# Patient Record
Sex: Female | Born: 1980 | Race: White | Hispanic: No | Marital: Married | Smoking: Never smoker
Health system: Southern US, Community
[De-identification: ages and names within clinical notes are randomized; demographics above are authoritative.]

## PROBLEM LIST (undated history)

## (undated) DIAGNOSIS — G43909 Migraine, unspecified, not intractable, without status migrainosus: Secondary | ICD-10-CM

## (undated) DIAGNOSIS — M5430 Sciatica, unspecified side: Secondary | ICD-10-CM

## (undated) DIAGNOSIS — F419 Anxiety disorder, unspecified: Secondary | ICD-10-CM

## (undated) DIAGNOSIS — M25562 Pain in left knee: Secondary | ICD-10-CM

## (undated) DIAGNOSIS — F1190 Opioid use, unspecified, uncomplicated: Secondary | ICD-10-CM

## (undated) DIAGNOSIS — F4310 Post-traumatic stress disorder, unspecified: Secondary | ICD-10-CM

## (undated) DIAGNOSIS — S060XAA Concussion with loss of consciousness status unknown, initial encounter: Secondary | ICD-10-CM

## (undated) DIAGNOSIS — M545 Low back pain, unspecified: Secondary | ICD-10-CM

## (undated) DIAGNOSIS — E282 Polycystic ovarian syndrome: Secondary | ICD-10-CM

## (undated) DIAGNOSIS — F319 Bipolar disorder, unspecified: Secondary | ICD-10-CM

## (undated) HISTORY — DX: Opioid use, unspecified, uncomplicated: F11.90

## (undated) HISTORY — DX: Low back pain, unspecified: M54.50

## (undated) HISTORY — PX: FINGER AMPUTATION: SHX636

## (undated) HISTORY — DX: Sciatica, unspecified side: M54.30

## (undated) HISTORY — DX: Post-traumatic stress disorder, unspecified: F43.10

## (undated) HISTORY — DX: Bipolar disorder, unspecified: F31.9

## (undated) HISTORY — DX: Concussion with loss of consciousness status unknown, initial encounter: S06.0XAA

## (undated) HISTORY — PX: TONSILLECTOMY: SUR1361

## (undated) HISTORY — DX: Pain in left knee: M25.562

## (undated) HISTORY — DX: Anxiety disorder, unspecified: F41.9

---
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
## 2001-03-18 ENCOUNTER — Encounter (INDEPENDENT_AMBULATORY_CARE_PROVIDER_SITE_OTHER): Payer: Self-pay | Admitting: *Deleted

## 2001-03-18 ENCOUNTER — Ambulatory Visit (HOSPITAL_BASED_OUTPATIENT_CLINIC_OR_DEPARTMENT_OTHER): Admission: RE | Admit: 2001-03-18 | Discharge: 2001-03-18 | Payer: Self-pay | Admitting: Otolaryngology

## 2004-02-24 ENCOUNTER — Emergency Department (HOSPITAL_COMMUNITY): Admission: EM | Admit: 2004-02-24 | Discharge: 2004-02-24 | Payer: Self-pay | Admitting: Emergency Medicine

## 2004-04-26 ENCOUNTER — Emergency Department (HOSPITAL_COMMUNITY): Admission: EM | Admit: 2004-04-26 | Discharge: 2004-04-26 | Payer: Self-pay | Admitting: *Deleted

## 2004-06-07 ENCOUNTER — Ambulatory Visit (HOSPITAL_COMMUNITY): Admission: RE | Admit: 2004-06-07 | Discharge: 2004-06-07 | Payer: Self-pay | Admitting: *Deleted

## 2007-05-31 ENCOUNTER — Emergency Department (HOSPITAL_COMMUNITY): Admission: EM | Admit: 2007-05-31 | Discharge: 2007-05-31 | Payer: Self-pay | Admitting: Emergency Medicine

## 2008-12-20 ENCOUNTER — Emergency Department (HOSPITAL_COMMUNITY): Admission: EM | Admit: 2008-12-20 | Discharge: 2008-12-21 | Payer: Self-pay | Admitting: Emergency Medicine

## 2010-10-21 NOTE — Op Note (Signed)
[place]  Patient:    Leslie Larson, Leslie Larson Visit Number: [id number] MRN: [id number]          Service Type: DSU Location: [facility] Attending Physician:  Susy Frizzle Dictated by:   Jeannett Senior Pollyann Kennedy, M.D. Proc. Date: 03/18/01 Admit Date:  03/18/2001                             Operative Report  PREOPERATIVE DIAGNOSIS:  Chronic tonsillitis.  POSTOPERATIVE DIAGNOSIS:  Chronic tonsillitis.  OPERATION PERFORMED:  Tonsillectomy.  SURGEON:  Jefry H. Pollyann Kennedy, M.D.  ANESTHESIA:  General endotracheal.  COMPLICATIONS:  None.  ESTIMATED BLOOD LOSS:  None.  OPERATIVE FINDINGS:  Diffuse enlargement of the tonsils with deep cryptic spaces, no evidence of debris or infection present today.  The patient tolerated the procedure well, was awakened, extubated and transferred to the recovery room in stable condition.  INDICATIONS FOR PROCEDURE:  The patient is an 30 year old girl with a lifelong history of recurring streptococcal tonsillitis multiple times each year.  The risks, benefits, alternatives and complications of the procedure were explained to the patient and her parents who seemed to understand and agreed to surgery.  DESCRIPTION OF PROCEDURE:  The patient was taken to the operating room and placed on the operating table in supine position.  Following induction of general endotracheal anesthesia, the table was turned 90 degrees.  The patient was draped in a standard fashion.  A Crowe-Davis mouth gag was inserted into the oral cavity and used to retract the tongue and mandible and fastened to the Mayo stand.  A red rubber catheter was inserted into the right side of the nose and withdrawn through the mouth and used to retract the soft palate and uvula.  Indirect exam of the nasopharynx was performed.  The adenoid tissue was extremely small and without any signs of infection of inflammation. Tonsils were dissected cleanly from their fossa between the  capsule and the constrictor muscles using electrocautery dissection.  There were no vessels encountered during the dissection.  They were sent together for pathologic evaluation. There was no bleeding encountered.  The pharynx was suctioned of secretions and the orogastric tube was used to aspirate the contents of the stomach.  The mouth gag was released and the pharynx was reinspected.  Again there was no bleeding noted.  The patient was then awakened, extubated and transferred to recovery. Dictated by:   Jeannett Senior Pollyann Kennedy, M.D. Attending Physician:  Susy Frizzle DD:  03/18/01 TD:  03/18/01 Job: [id number]

## 2011-03-10 LAB — HCG, QUANTITATIVE, PREGNANCY: hCG, Beta Chain, Quant, S: <2

## 2012-04-18 ENCOUNTER — Emergency Department (HOSPITAL_COMMUNITY)
Admission: EM | Admit: 2012-04-18 | Discharge: 2012-04-18 | Disposition: A | Discharge status: Home / Self Care | Payer: Medicaid Other | Attending: Emergency Medicine | Admitting: Emergency Medicine

## 2012-04-18 ENCOUNTER — Encounter (HOSPITAL_COMMUNITY): Payer: Self-pay | Admitting: *Deleted

## 2012-04-18 ENCOUNTER — Emergency Department (HOSPITAL_COMMUNITY): Payer: Medicaid Other

## 2012-04-18 DIAGNOSIS — R319 Hematuria, unspecified: Secondary | ICD-10-CM | POA: Insufficient documentation

## 2012-04-18 DIAGNOSIS — R112 Nausea with vomiting, unspecified: Secondary | ICD-10-CM | POA: Insufficient documentation

## 2012-04-18 DIAGNOSIS — N390 Urinary tract infection, site not specified: Secondary | ICD-10-CM | POA: Insufficient documentation

## 2012-04-18 LAB — URINALYSIS, ROUTINE W REFLEX MICROSCOPIC
Glucose, UA: NEGATIVE mg/dL
Ketones, ur: NEGATIVE mg/dL
Leukocytes, UA: NEGATIVE
Nitrite: NEGATIVE
Protein, ur: NEGATIVE mg/dL
Urobilinogen, UA: 0.2 mg/dL (ref 0.0–1.0)

## 2012-04-18 LAB — URINE MICROSCOPIC-ADD ON

## 2012-04-18 MED ORDER — HYDROMORPHONE HCL PF 1 MG/ML IJ SOLN
1.0000 mg | Freq: Once | INTRAMUSCULAR | Status: AC
  Administered 2012-04-18: 1 mg via INTRAVENOUS
  Filled 2012-04-18: qty 1

## 2012-04-18 MED ORDER — CIPROFLOXACIN HCL 500 MG PO TABS: 500.0000 mg | ORAL_TABLET | Freq: Two times a day (BID) | ORAL | Status: DC

## 2012-04-18 MED ORDER — OXYCODONE-ACETAMINOPHEN 5-325 MG PO TABS: 1.0000 | ORAL_TABLET | ORAL | Status: AC | PRN

## 2012-04-18 MED ORDER — CIPROFLOXACIN HCL 250 MG PO TABS
500.0000 mg | ORAL_TABLET | Freq: Once | ORAL | Status: AC
  Administered 2012-04-18: 500 mg via ORAL
  Filled 2012-04-18: qty 2

## 2012-04-18 MED ORDER — SODIUM CHLORIDE 0.9 % IV SOLN
Freq: Once | INTRAVENOUS | Status: AC
  Administered 2012-04-18: 09:00:00 via INTRAVENOUS

## 2012-04-18 MED ORDER — ONDANSETRON HCL 4 MG/2ML IJ SOLN
4.0000 mg | Freq: Once | INTRAMUSCULAR | Status: AC
  Administered 2012-04-18: 4 mg via INTRAVENOUS
  Filled 2012-04-18: qty 2

## 2012-04-18 NOTE — ED Provider Notes (Signed)
History  This chart was scribed for Leslie Cooper III, MD by Ardeen Jourdain, ED Scribe. This patient was seen in room APA03/APA03 and the patient's care was started at 0800.  CSN: [id number]  Arrival date & time 04/18/12  0721   First MD Initiated Contact with Patient 04/18/12 0800      Chief Complaint  Patient presents with  . Flank Pain    The history is provided by the patient. No language interpreter was used.    Leslie Larson is a 31 y.o. female who presents to the Emergency Department complaining of flank pain with associated nausea, emesis, hemeturia and painful urination. She denies fever, ear ache, sore throat, cough, SOB, CP, diarrhea, rash, LOC or seizures. She states that the pain started suddenly this morning at 0200 and has been gradually worsening. She reports a similar occurrence of these symptoms when she had kidney stones a few years ago. Her LMP was 6 months ago due to irregular periods. Pt does not have a h/o chronic medical conditions. She denies smoking and alcohol use.     History reviewed. No pertinent past medical history.  Past Surgical History  Procedure Date  . Cesarean section   . Finger amputation     right pinky finger  . Tonsillectomy     No family history on file.  History  Substance Use Topics  . Smoking status: Never Smoker   . Smokeless tobacco: Not on file  . Alcohol Use: No   No OB history available.   Review of Systems  Gastrointestinal: Positive for nausea and vomiting.  Genitourinary: Positive for dysuria and flank pain.  All other systems reviewed and are negative.    Allergies  Tramadol; Mobic; Penicillins; and Sulfa antibiotics  Home Medications  No current outpatient prescriptions on file.  Triage Vitals: BP 114/84  Pulse 73  Temp 97.6 F (36.4 C)  Resp 20  Ht 4\' 11"  (1.499 m)  Wt 168 lb (76.204 kg)  BMI 33.93 kg/m2  SpO2 100%  Physical Exam  Nursing note and vitals reviewed. Constitutional: She is  oriented to person, place, and time. She appears well-developed and well-nourished. No distress.  HENT:  Head: Normocephalic and atraumatic.  Eyes: EOM are normal. Pupils are equal, round, and reactive to light.  Neck: Normal range of motion. Neck supple. No tracheal deviation present.  Cardiovascular: Normal rate, regular rhythm and normal heart sounds.   Pulmonary/Chest: Effort normal and breath sounds normal. No respiratory distress. She exhibits no tenderness.  Abdominal: Soft. She exhibits no distension. There is no tenderness.  Musculoskeletal: Normal range of motion. She exhibits no edema.       Extremities normal, localized flank pain to left side but no bony deformities or point tenderness   Lymphadenopathy:    She has no cervical adenopathy.  Neurological: She is alert and oriented to person, place, and time.  Skin: Skin is warm and dry.  Psychiatric: She has a normal mood and affect. Her behavior is normal.    ED Course  Procedures (including critical care time)  DIAGNOSTIC STUDIES: Oxygen Saturation is 100% on room air, normal by my interpretation.    COORDINATION OF CARE:  8:16 AM: Discussed treatment plan which includes a urinalysis and pain medications with pt at bedside and pt agreed to plan.  8:30 AM: Medication Orders- 0.9 % sodium chloride infusion Once, HYDROmorphone (DILAUDID) injection 1 mg Once. ondansetron (ZOFRAN) injection 4 mg Once   Results for orders placed during the hospital encounter  of 04/18/12  URINALYSIS, ROUTINE W REFLEX MICROSCOPIC      Component Value Range   Color, Urine YELLOW  YELLOW   APPearance CLEAR  CLEAR   Specific Gravity, Urine >1.030 (*) 1.005 - 1.030   pH 6.0  5.0 - 8.0   Glucose, UA NEGATIVE  NEGATIVE mg/dL   Hgb urine dipstick LARGE (*) NEGATIVE   Bilirubin Urine NEGATIVE  NEGATIVE   Ketones, ur NEGATIVE  NEGATIVE mg/dL   Protein, ur NEGATIVE  NEGATIVE mg/dL   Urobilinogen, UA 0.2  0.0 - 1.0 mg/dL   Nitrite NEGATIVE   NEGATIVE   Leukocytes, UA NEGATIVE  NEGATIVE  PREGNANCY, URINE      Component Value Range   Preg Test, Ur NEGATIVE  NEGATIVE  URINE MICROSCOPIC-ADD ON      Component Value Range   Squamous Epithelial / LPF FEW (*) RARE   RBC / HPF TOO NUMEROUS TO COUNT  <3 RBC/hpf   Bacteria, UA MANY (*) RARE   Ct Abdomen Pelvis Wo Contrast  04/18/2012  *RADIOLOGY REPORT*  Clinical Data: Left-sided flank pain.  CT ABDOMEN AND PELVIS WITHOUT CONTRAST  Technique:  Multidetector CT imaging of the abdomen and pelvis was performed following the standard protocol without intravenous contrast.  Comparison: CT of the abdomen and pelvis 01/09/2007.  Findings:  Lung Bases: There are two 4 mm subpleural nodules in the left lower lobe (both on image 58 of series 5), in addition to a 3 mm subpleural nodule in the periphery of the right lower lobe (image 47 of series 5), which are all are unchanged compared to the prior examination and can be considered radiographically benign requiring no imaging follow-up.  Abdomen/Pelvis:  There are no abnormal calcifications within the collecting system of either kidney, along the course of either ureter, or within the lumen of the urinary bladder.  No hydroureteronephrosis or perinephric stranding to suggest urinary tract obstruction at this time.  The unenhanced appearance of the liver, gallbladder, pancreas, spleen and bilateral adrenal glands is unremarkable.  There is no ascites or pneumoperitoneum and no pathologic distension of small bowel.  No definite pathologic lymphadenopathy identified within the abdomen or pelvis on this noncontrast CT examination.  The appendix is normal.  Uterus and bilateral ovaries are unremarkable in appearance.  Musculoskeletal: There are no aggressive appearing lytic or blastic lesions noted in the visualized portions of the skeleton.  IMPRESSION: 1.  No acute findings in the abdomen or pelvis to account for the patient's symptoms.  Specifically, no abnormal  urinary tract calculi or findings to suggest urinary tract obstruction. 2.  Normal appendix.   Original Report Authenticated By: Trudie Reed, M.D.    CT did not show kidney stones.  UA showed RBC's TNTC, many bacteria.  Will Rx for UTI with Cipro 500 mg bid x 7 days, Percocet q4h prn pain   1. Urinary tract infection     I personally performed the services described in this documentation, which was scribed in my presence. The recorded information has been reviewed and is accurate.  Osvaldo Human, MD      Leslie Cooper III, MD 04/18/12 1019

## 2012-04-18 NOTE — ED Notes (Signed)
Right sided flank/back pain that started about 0200 this morning, worsening.  C/o painful urination and n/v.

## 2012-08-16 ENCOUNTER — Encounter (HOSPITAL_COMMUNITY): Payer: Self-pay

## 2012-08-16 ENCOUNTER — Emergency Department (HOSPITAL_COMMUNITY)
Admission: EM | Admit: 2012-08-16 | Discharge: 2012-08-16 | Disposition: A | Discharge status: Home / Self Care | Payer: Medicaid Other | Attending: Emergency Medicine | Admitting: Emergency Medicine

## 2012-08-16 DIAGNOSIS — Z8679 Personal history of other diseases of the circulatory system: Secondary | ICD-10-CM | POA: Insufficient documentation

## 2012-08-16 DIAGNOSIS — H9209 Otalgia, unspecified ear: Secondary | ICD-10-CM | POA: Insufficient documentation

## 2012-08-16 DIAGNOSIS — K089 Disorder of teeth and supporting structures, unspecified: Secondary | ICD-10-CM | POA: Insufficient documentation

## 2012-08-16 DIAGNOSIS — K0889 Other specified disorders of teeth and supporting structures: Secondary | ICD-10-CM

## 2012-08-16 HISTORY — DX: Migraine, unspecified, not intractable, without status migrainosus: G43.909

## 2012-08-16 MED ORDER — CLINDAMYCIN HCL 150 MG PO CAPS
300.0000 mg | ORAL_CAPSULE | Freq: Once | ORAL | Status: AC
  Administered 2012-08-16: 300 mg via ORAL
  Filled 2012-08-16: qty 2

## 2012-08-16 MED ORDER — HYDROCODONE-ACETAMINOPHEN 5-325 MG PO TABS: 1.0000 | ORAL_TABLET | ORAL | Status: DC | PRN

## 2012-08-16 MED ORDER — CLINDAMYCIN HCL 300 MG PO CAPS: 300.0000 mg | ORAL_CAPSULE | Freq: Four times a day (QID) | ORAL | Status: DC

## 2012-08-16 MED ORDER — HYDROCODONE-ACETAMINOPHEN 5-325 MG PO TABS
1.0000 | ORAL_TABLET | Freq: Once | ORAL | Status: AC
  Administered 2012-08-16: 1 via ORAL
  Filled 2012-08-16: qty 1

## 2012-08-16 NOTE — ED Notes (Signed)
Pain to right side of mouth, does not know if upper or lower tooth.  Pt also c/o pain to right ear

## 2012-08-16 NOTE — ED Provider Notes (Signed)
History     CSN: [id number]  Arrival date & time 08/16/12  0146   First MD Initiated Contact with Patient 08/16/12 0154      Chief Complaint  Patient presents with  . Dental Pain  . Otalgia     The history is provided by the patient.   patient reports several days of worsening right upper lower first molar pain.  She reports the pain is radiating towards her right year.  No drainage from her right year.  She's had pain in his teeth before.  She does not have a dentist.  She's tried over-the-counter medications without improvement in her symptoms.  Her pain is mild to moderate.  Nothing worsens or improves her symptoms.  Symptoms are constant.  No fevers or chills.  No difficulty swallowing or breathing  Past Medical History  Diagnosis Date  . Migraine     Past Surgical History  Procedure Laterality Date  . Cesarean section    . Finger amputation      right pinky finger  . Tonsillectomy      No family history on file.  History  Substance Use Topics  . Smoking status: Never Smoker   . Smokeless tobacco: Not on file  . Alcohol Use: No    OB History   Grav Para Term Preterm Abortions TAB SAB Ect Mult Living                  Review of Systems  HENT: Positive for ear pain.   All other systems reviewed and are negative.    Allergies  Tramadol; Mobic; Penicillins; and Sulfa antibiotics  Home Medications   Current Outpatient Rx  Name  Route  Sig  Dispense  Refill  . ciprofloxacin (CIPRO) 500 MG tablet   Oral   Take 1 tablet (500 mg total) by mouth every 12 (twelve) hours.   14 tablet   0   . clindamycin (CLEOCIN) 300 MG capsule   Oral   Take 1 capsule (300 mg total) by mouth 4 (four) times daily.   28 capsule   0   . HYDROcodone-acetaminophen (NORCO/VICODIN) 5-325 MG per tablet   Oral   Take 1 tablet by mouth every 4 (four) hours as needed for pain.   15 tablet   0     Please do not filled narcotic medication unless an ...     BP 141/74  Pulse  84  Temp(Src) 98.4 F (36.9 C) (Oral)  Resp 18  Ht 4\' 11"  (1.499 m)  Wt 167 lb (75.751 kg)  BMI 33.71 kg/m2  SpO2 99%  Physical Exam  Nursing note and vitals reviewed. Constitutional: She is oriented to person, place, and time. She appears well-developed and well-nourished.  HENT:  Head: Normocephalic.  Dental decay of right upper and lower first molar.  No gingival swelling.  No fluctuance.  Tolerating secretions.  Oral airway patent.  Tissue under the tongue is soft  Eyes: EOM are normal.  Neck: Normal range of motion.  Pulmonary/Chest: Effort normal.  Abdominal: She exhibits no distension.  Musculoskeletal: Normal range of motion.  Neurological: She is alert and oriented to person, place, and time.  Psychiatric: She has a normal mood and affect.    ED Course  Procedures (including critical care time)  Labs Reviewed - No data to display No results found.   1. Pain, dental       MDM  Dental Pain. Home with antibiotics and pain medicine. Recommend dental follow  up. No signs of gingival abscess. Tolerating secretions. Airway patent. No sub lingular swelling         Lyanne Co, MD 08/16/12 0222

## 2012-08-20 ENCOUNTER — Emergency Department (HOSPITAL_COMMUNITY): Payer: Medicaid Other

## 2012-08-20 ENCOUNTER — Encounter (HOSPITAL_COMMUNITY): Payer: Self-pay | Admitting: *Deleted

## 2012-08-20 ENCOUNTER — Emergency Department (HOSPITAL_COMMUNITY)
Admission: EM | Admit: 2012-08-20 | Discharge: 2012-08-20 | Disposition: A | Discharge status: Home / Self Care | Payer: Medicaid Other | Attending: Emergency Medicine | Admitting: Emergency Medicine

## 2012-08-20 DIAGNOSIS — Y92009 Unspecified place in unspecified non-institutional (private) residence as the place of occurrence of the external cause: Secondary | ICD-10-CM | POA: Insufficient documentation

## 2012-08-20 DIAGNOSIS — S20229A Contusion of unspecified back wall of thorax, initial encounter: Secondary | ICD-10-CM | POA: Insufficient documentation

## 2012-08-20 DIAGNOSIS — S300XXA Contusion of lower back and pelvis, initial encounter: Secondary | ICD-10-CM

## 2012-08-20 DIAGNOSIS — Y939 Activity, unspecified: Secondary | ICD-10-CM | POA: Insufficient documentation

## 2012-08-20 DIAGNOSIS — Z3202 Encounter for pregnancy test, result negative: Secondary | ICD-10-CM | POA: Insufficient documentation

## 2012-08-20 DIAGNOSIS — W010XXA Fall on same level from slipping, tripping and stumbling without subsequent striking against object, initial encounter: Secondary | ICD-10-CM | POA: Insufficient documentation

## 2012-08-20 MED ORDER — NAPROXEN 500 MG PO TABS: 500.0000 mg | ORAL_TABLET | Freq: Two times a day (BID) | ORAL | Status: DC

## 2012-08-20 MED ORDER — HYDROCODONE-ACETAMINOPHEN 5-325 MG PO TABS
1.0000 | ORAL_TABLET | Freq: Once | ORAL | Status: AC
  Administered 2012-08-20: 1 via ORAL
  Filled 2012-08-20: qty 1

## 2012-08-20 NOTE — ED Provider Notes (Signed)
History     CSN: [id number]  Arrival date & time 08/20/12  1548   First MD Initiated Contact with Patient 08/20/12 1635      Chief Complaint  Patient presents with  . Fall    (Consider location/radiation/quality/duration/timing/severity/associated sxs/prior treatment) HPI Comments: Patient c/o pain to her lower back and tailbone that began after she slipped and fell , landing on her buttocks.  Also noticed some bruising to the area.  She denies perineal numbness, LE weakness, abd pain, dysuria or hematuria.  Pain is worse with sitting and improves with rest.    Patient is a 32 y.o. female presenting with back pain. The history is provided by the patient.  Back Pain Location:  Lumbar spine (coccyx) Quality:  Aching Radiates to:  Does not radiate Pain severity:  Moderate Pain is:  Same all the time Onset quality:  Sudden Timing:  Constant Progression:  Unchanged Chronicity:  New Context: falling   Relieved by:  Bed rest Worsened by:  Sitting and movement Ineffective treatments:  None tried Associated symptoms: no abdominal pain, no abdominal swelling, no bladder incontinence, no bowel incontinence, no dysuria, no fever, no leg pain, no numbness, no paresthesias, no pelvic pain, no perianal numbness, no tingling, no weakness and no weight loss     Past Medical History  Diagnosis Date  . Migraine     Past Surgical History  Procedure Laterality Date  . Cesarean section    . Finger amputation      right pinky finger  . Tonsillectomy      No family history on file.  History  Substance Use Topics  . Smoking status: Never Smoker   . Smokeless tobacco: Not on file  . Alcohol Use: No    OB History   Grav Para Term Preterm Abortions TAB SAB Ect Mult Living                  Review of Systems  Constitutional: Negative for fever, chills and weight loss.  Gastrointestinal: Negative for abdominal pain and bowel incontinence.  Genitourinary: Negative for bladder  incontinence, dysuria, frequency, hematuria, vaginal bleeding, difficulty urinating and pelvic pain.  Musculoskeletal: Positive for back pain, joint swelling and arthralgias. Negative for gait problem.  Skin: Negative for color change and wound.  Neurological: Negative for dizziness, tingling, weakness, numbness and paresthesias.  All other systems reviewed and are negative.    Allergies  Tramadol; Mobic; Penicillins; and Sulfa antibiotics  Home Medications   Current Outpatient Rx  Name  Route  Sig  Dispense  Refill  . clindamycin (CLEOCIN) 300 MG capsule   Oral   Take 1 capsule (300 mg total) by mouth 4 (four) times daily.   28 capsule   0   . ibuprofen (ADVIL,MOTRIN) 200 MG tablet   Oral   Take 400 mg by mouth once as needed for pain.           BP 115/80  Pulse 104  Temp(Src) 98.1 F (36.7 C)  Resp 20  Ht 4\' 11"  (1.499 m)  Wt 167 lb (75.751 kg)  BMI 33.71 kg/m2  SpO2 99%  Physical Exam  Nursing note and vitals reviewed. Constitutional: She is oriented to person, place, and time. She appears well-developed and well-nourished. No distress.  HENT:  Head: Normocephalic and atraumatic.  Neck: Normal range of motion. Neck supple.  Cardiovascular: Normal rate, regular rhythm and intact distal pulses.   No murmur heard. Pulmonary/Chest: Effort normal and breath sounds normal.  Musculoskeletal: She exhibits tenderness. She exhibits no edema.       Lumbar back: She exhibits tenderness and pain. She exhibits normal range of motion, no swelling, no deformity, no laceration and normal pulse.       Back:  Neurological: She is alert and oriented to person, place, and time. No cranial nerve deficit or sensory deficit. She exhibits normal muscle tone. Coordination and gait normal.  Reflex Scores:      Patellar reflexes are 2+ on the right side and 2+ on the left side.      Achilles reflexes are 2+ on the right side and 2+ on the left side. Skin: Skin is warm and dry.    ED  Course  Procedures (including critical care time)  Results for orders placed during the hospital encounter of 08/20/12  POCT PREGNANCY, URINE      Result Value Range   Preg Test, Ur NEGATIVE  NEGATIVE    Dg Sacrum/coccyx  08/20/2012  *RADIOLOGY REPORT*  Clinical Data:  Slipped and fell, coccygeal pain.  SACRUM AND COCCYX - 2+ VIEW  Comparison: Bone window images from CT pelvis 04/18/2012.  Findings: No fractures identified involving the sacrum or coccyx. Sacroiliac joints and symphysis pubis intact.  Visualized lower lumbar spine unremarkable.  IMPRESSION: Normal examination.   Original Report Authenticated By: Hulan Saas, M.D.         MDM    Mild bruising over the distal sacrum and coccyx.  No abrasion or edema.  Patient is ambulatory, no focal neuro deficits.  Likely contusion.    Patient agrees to ice, dough nut ring for sitting, close f/u with her PMD  The patient appears reasonably screened and/or stabilized for discharge and I doubt any other medical condition or other [facility] requiring further screening, evaluation, or treatment in the ED at this time prior to discharge.       Rainee Sweatt L. Trisha Mangle, PA-C 08/22/12 1522

## 2012-08-20 NOTE — ED Notes (Signed)
Larey Seat this am at home, pain in tailbone

## 2012-08-25 NOTE — ED Provider Notes (Signed)
Medical screening examination/treatment/procedure(s) were performed by non-physician practitioner and as supervising physician I was immediately available for consultation/collaboration.   Jd Mccaster M Jullie Arps, DO 08/25/12 0126 

## 2012-09-16 ENCOUNTER — Emergency Department (HOSPITAL_COMMUNITY)
Admission: EM | Admit: 2012-09-16 | Discharge: 2012-09-16 | Disposition: A | Discharge status: Home / Self Care | Payer: Medicaid Other | Attending: Emergency Medicine | Admitting: Emergency Medicine

## 2012-09-16 ENCOUNTER — Encounter (HOSPITAL_COMMUNITY): Payer: Self-pay | Admitting: Emergency Medicine

## 2012-09-16 DIAGNOSIS — K0889 Other specified disorders of teeth and supporting structures: Secondary | ICD-10-CM

## 2012-09-16 DIAGNOSIS — K089 Disorder of teeth and supporting structures, unspecified: Secondary | ICD-10-CM | POA: Insufficient documentation

## 2012-09-16 DIAGNOSIS — Z8679 Personal history of other diseases of the circulatory system: Secondary | ICD-10-CM | POA: Insufficient documentation

## 2012-09-16 NOTE — ED Notes (Signed)
Patient c/o right upper dental pain and swelling. Per patient was seen here 3 weeks ago and got antibiotics but pain and swelling returnned. Has appointment scheduled with referred dentist on 28th.

## 2012-09-16 NOTE — ED Provider Notes (Signed)
History    This chart was scribed for Joya Gaskins, MD by Quintella Reichert, ED scribe.  This patient was seen in room APA07/APA07 and the patient's care was started at 8:17 AM.    CSN: [id number]  Arrival date & time 09/16/12  0806      Chief Complaint  Patient presents with  . Dental Pain     The history is provided by the patient. No language interpreter was used.   Leslie Larson is a 32 y.o. female who presents to the Emergency Department complaining of dental pain caused by a broken tooth.  Pt states she came to the ED 3 weeks ago and was given pain medication and antibiotics, and that the pain has returned since that time.  She states she has an appointment with dentist on 4/28.  She denies fever and vomiting.  Pt states she has no other medical issues.   Past Medical History  Diagnosis Date  . Migraine     Past Surgical History  Procedure Laterality Date  . Cesarean section    . Finger amputation      right pinky finger  . Tonsillectomy      No family history on file.  History  Substance Use Topics  . Smoking status: Never Smoker   . Smokeless tobacco: Not on file  . Alcohol Use: No    OB History   Grav Para Term Preterm Abortions TAB SAB Ect Mult Living                  Review of Systems  Constitutional: Negative for fever.  HENT: Positive for dental problem. Negative for facial swelling.   Gastrointestinal: Negative for vomiting.     Allergies  Tramadol; Mobic; Penicillins; and Sulfa antibiotics  Home Medications   Current Outpatient Rx  Name  Route  Sig  Dispense  Refill  . clindamycin (CLEOCIN) 300 MG capsule   Oral   Take 1 capsule (300 mg total) by mouth 4 (four) times daily.   28 capsule   0   . ibuprofen (ADVIL,MOTRIN) 200 MG tablet   Oral   Take 400 mg by mouth once as needed for pain.         . naproxen (NAPROSYN) 500 MG tablet   Oral   Take 1 tablet (500 mg total) by mouth 2 (two) times daily with a meal.   20  tablet   0     BP 137/92  Pulse 109  Temp(Src) 98.2 F (36.8 C) (Oral)  Resp 18  Ht 4\' 11"  (1.499 m)  Wt 167 lb (75.751 kg)  BMI 33.71 kg/m2  SpO2 97%  LMP 09/17/2011  Physical Exam  Nursing note and vitals reviewed. CONSTITUTIONAL: Well developed/well nourished HEAD AND FACE: Normocephalic/atraumatic EYES: EOMI/PERRL ENMT: Mucous membranes moist.  Poor dentition.  No trismus.  No focal abscess noted. NECK: supple no meningeal signs CV: S1/S2 noted, no murmurs/rubs/gallops noted LUNGS: Lungs are clear to auscultation bilaterally, no apparent distress ABDOMEN: soft, nontender, no rebound or guarding NEURO: Pt is awake/alert, moves all extremitiesx4 EXTREMITIES:full ROM SKIN: warm, color normal   ED Course  Procedures   Oxygen Saturation is 97% on room air, normal by my interpretation.    COORDINATION OF CARE: 8:20 AM-Discussed treatment plan which includes 600 mg ibuprofen 3x/day, advised pt that antibiotics are not necessary, and recommended f/u with dentist on 4/28.  Pt agreed to plan.       MDM  Nursing notes including past  medical history and social history reviewed and considered in documentation    I personally performed the services described in this documentation, which was scribed in my presence. The recorded information has been reviewed and is accurate.         Joya Gaskins, MD 09/16/12 [phone]

## 2012-09-20 ENCOUNTER — Emergency Department (HOSPITAL_COMMUNITY)
Admission: EM | Admit: 2012-09-20 | Discharge: 2012-09-20 | Disposition: A | Discharge status: Home / Self Care | Payer: Medicaid Other | Attending: Emergency Medicine | Admitting: Emergency Medicine

## 2012-09-20 ENCOUNTER — Encounter (HOSPITAL_COMMUNITY): Payer: Self-pay | Admitting: *Deleted

## 2012-09-20 DIAGNOSIS — H53149 Visual discomfort, unspecified: Secondary | ICD-10-CM | POA: Insufficient documentation

## 2012-09-20 DIAGNOSIS — G43909 Migraine, unspecified, not intractable, without status migrainosus: Secondary | ICD-10-CM | POA: Insufficient documentation

## 2012-09-20 DIAGNOSIS — R112 Nausea with vomiting, unspecified: Secondary | ICD-10-CM | POA: Insufficient documentation

## 2012-09-20 MED ORDER — METOCLOPRAMIDE HCL 5 MG/ML IJ SOLN
10.0000 mg | Freq: Once | INTRAMUSCULAR | Status: AC
  Administered 2012-09-20: 10 mg via INTRAVENOUS
  Filled 2012-09-20: qty 2

## 2012-09-20 MED ORDER — BUTALBITAL-APAP-CAFFEINE 50-325-40 MG PO TABS: 1.0000 | ORAL_TABLET | Freq: Four times a day (QID) | ORAL | Status: DC | PRN

## 2012-09-20 MED ORDER — HYDROMORPHONE HCL PF 1 MG/ML IJ SOLN: 1.0000 mg | Freq: Once | INTRAMUSCULAR | Status: DC

## 2012-09-20 MED ORDER — HYDROMORPHONE HCL PF 1 MG/ML IJ SOLN
1.0000 mg | Freq: Once | INTRAMUSCULAR | Status: AC
  Administered 2012-09-20: 1 mg via INTRAVENOUS

## 2012-09-20 MED ORDER — SODIUM CHLORIDE 0.9 % IV BOLUS (SEPSIS)
1000.0000 mL | Freq: Once | INTRAVENOUS | Status: AC
  Administered 2012-09-20: 1000 mL via INTRAVENOUS

## 2012-09-20 MED ORDER — DEXAMETHASONE SODIUM PHOSPHATE 10 MG/ML IJ SOLN
10.0000 mg | Freq: Once | INTRAMUSCULAR | Status: AC
  Administered 2012-09-20: 10 mg via INTRAVENOUS
  Filled 2012-09-20: qty 1

## 2012-09-20 MED ORDER — HYDROMORPHONE HCL PF 1 MG/ML IJ SOLN
1.0000 mg | Freq: Once | INTRAMUSCULAR | Status: DC
  Filled 2012-09-20: qty 1

## 2012-09-20 MED ORDER — DIPHENHYDRAMINE HCL 50 MG/ML IJ SOLN
50.0000 mg | Freq: Once | INTRAMUSCULAR | Status: AC
  Administered 2012-09-20: 50 mg via INTRAVENOUS
  Filled 2012-09-20: qty 1

## 2012-09-20 NOTE — ED Provider Notes (Signed)
History     CSN: [id number]  Arrival date & time 09/20/12  3086   First MD Initiated Contact with Patient 09/20/12 [phone]      Chief Complaint  Patient presents with  . Migraine    (Consider location/radiation/quality/duration/timing/severity/associated sxs/prior treatment) HPI Comments: Leslie Larson is a 32 y.o. Female presenting with migraine headache which started yesterday.  The patient has a history of migraine and the current symptoms are similar to previous episodes of migraine headache.  The patients symptoms are sometimes preceded by photophobia which occurred yesterday. The patient has right sided pain in association with photo and phonophobia and has had considerable nausea.  She has had little PO intake since yesterday.  There has been no fevers, chills, syncope, confusion or localized weakness.  The patient tried ibuprofen without relief of symptoms. She used to take tramadol and mobic for her headaches but were recently discontinued as these medicines were thought to be causing blisters in her mouth.      The history is provided by the patient.    Past Medical History  Diagnosis Date  . Migraine     Past Surgical History  Procedure Laterality Date  . Cesarean section    . Finger amputation      right pinky finger  . Tonsillectomy      Family History  Problem Relation Age of Onset  . Hypertension Mother   . Diabetes Father   . Heart failure Father     History  Substance Use Topics  . Smoking status: Never Smoker   . Smokeless tobacco: Never Used  . Alcohol Use: No    OB History   Grav Para Term Preterm Abortions TAB SAB Ect Mult Living   4 1 1  3  3   1       Review of Systems  Constitutional: Negative for fever.  HENT: Negative for congestion, sore throat and neck pain.   Eyes: Positive for photophobia. Negative for visual disturbance.  Respiratory: Negative for chest tightness and shortness of breath.   Cardiovascular: Negative for chest  pain.  Gastrointestinal: Positive for nausea and vomiting. Negative for abdominal pain.  Genitourinary: Negative.   Musculoskeletal: Negative for joint swelling and arthralgias.  Skin: Negative.  Negative for rash and wound.  Neurological: Positive for headaches. Negative for dizziness, seizures, weakness, light-headedness and numbness.  Psychiatric/Behavioral: Negative.     Allergies  Tramadol; Mobic; Penicillins; and Sulfa antibiotics  Home Medications   Current Outpatient Rx  Name  Route  Sig  Dispense  Refill  . butalbital-acetaminophen-caffeine (FIORICET) 50-325-40 MG per tablet   Oral   Take 1 tablet by mouth every 6 (six) hours as needed for headache.   20 tablet   0     BP 135/90  Pulse 102  Temp(Src) 98.6 F (37 C) (Oral)  Resp 14  Ht 4\' 11"  (1.499 m)  Wt 167 lb (75.751 kg)  BMI 33.71 kg/m2  SpO2 96%  LMP 09/17/2011  Physical Exam  Nursing note and vitals reviewed. Constitutional: She is oriented to person, place, and time. She appears well-developed and well-nourished.  Uncomfortable appearing  HENT:  Head: Normocephalic and atraumatic.  Mouth/Throat: Oropharynx is clear and moist.  Eyes: EOM are normal. Pupils are equal, round, and reactive to light.  Neck: Normal range of motion. Neck supple.  Cardiovascular: Normal rate and normal heart sounds.   Pulmonary/Chest: Effort normal.  Abdominal: Soft. There is no tenderness.  Musculoskeletal: Normal range of motion.  Lymphadenopathy:  She has no cervical adenopathy.  Neurological: She is alert and oriented to person, place, and time. She has normal strength. No sensory deficit. Gait normal. GCS eye subscore is 4. GCS verbal subscore is 5. GCS motor subscore is 6.  Normal heel-shin, normal rapid alternating movements. Cranial nerves III-XII intact.  No pronator drift.  Skin: Skin is warm and dry. No rash noted.  Psychiatric: She has a normal mood and affect. Her speech is normal and behavior is normal.  Thought content normal. Cognition and memory are normal.    ED Course  Procedures (including critical care time)  Labs Reviewed - No data to display No results found.   1. Migraine headache     Medications  sodium chloride 0.9 % bolus 1,000 mL (1,000 mLs Intravenous New Bag/Given 09/20/12 1001)  diphenhydrAMINE (BENADRYL) injection 50 mg (50 mg Intravenous Given 09/20/12 1002)  dexamethasone (DECADRON) injection 10 mg (10 mg Intravenous Given 09/20/12 1011)  metoCLOPramide (REGLAN) injection 10 mg (10 mg Intravenous Given 09/20/12 1019)   Pts nausea resolved,  Headache improved to 8/10 from 10/10.  Dilaudid 1 mg added.  Headache pain improved after second medication given, 1/10  MDM  No change in exam at recheck,  Patient nonfocal.  Ambulatory and appears much comfortable at dc. Pt with her typical classic migraine which did respond to treatment.  No neuro deficits.  The patient appears reasonably screened and/or stabilized for discharge and I doubt any other medical condition or other [facility] requiring further screening, evaluation, or treatment in the ED at this time prior to discharge.  She was prescribed fioricet to try with her next migraine headache.  Also encouraged f/u with pcp prn.  Advised to not drive given sedating meds.  Husband to drive home.        Burgess Amor, PA-C 09/20/12 2152

## 2012-09-20 NOTE — ED Notes (Signed)
Patient reports that this headache feels similar to her past migraines and was precipitated by the same symptoms.

## 2012-09-20 NOTE — ED Notes (Addendum)
Patient ambulated to the bathroom. Appeared balanced and symmetrical.

## 2012-09-20 NOTE — ED Notes (Addendum)
Patient w/hx of migraines, began w/HA yesterday.  Had to be taken off migraine meds d/t ADRs

## 2012-09-21 NOTE — ED Provider Notes (Signed)
Medical screening examination/treatment/procedure(s) were performed by non-physician practitioner and as supervising physician I was immediately available for consultation/collaboration.   Laray Anger, DO 09/21/12 [phone]

## 2012-10-14 ENCOUNTER — Emergency Department (HOSPITAL_COMMUNITY)
Admission: EM | Admit: 2012-10-14 | Discharge: 2012-10-14 | Disposition: A | Discharge status: Home / Self Care | Payer: Medicaid Other | Attending: Emergency Medicine | Admitting: Emergency Medicine

## 2012-10-14 ENCOUNTER — Encounter (HOSPITAL_COMMUNITY): Payer: Self-pay

## 2012-10-14 ENCOUNTER — Emergency Department (HOSPITAL_COMMUNITY): Payer: Medicaid Other

## 2012-10-14 DIAGNOSIS — M545 Low back pain, unspecified: Secondary | ICD-10-CM | POA: Insufficient documentation

## 2012-10-14 DIAGNOSIS — R109 Unspecified abdominal pain: Secondary | ICD-10-CM | POA: Insufficient documentation

## 2012-10-14 DIAGNOSIS — R300 Dysuria: Secondary | ICD-10-CM | POA: Insufficient documentation

## 2012-10-14 DIAGNOSIS — N390 Urinary tract infection, site not specified: Secondary | ICD-10-CM | POA: Insufficient documentation

## 2012-10-14 DIAGNOSIS — S68118A Complete traumatic metacarpophalangeal amputation of other finger, initial encounter: Secondary | ICD-10-CM | POA: Insufficient documentation

## 2012-10-14 DIAGNOSIS — Z880 Allergy status to penicillin: Secondary | ICD-10-CM | POA: Insufficient documentation

## 2012-10-14 DIAGNOSIS — Z8679 Personal history of other diseases of the circulatory system: Secondary | ICD-10-CM | POA: Insufficient documentation

## 2012-10-14 DIAGNOSIS — Z87442 Personal history of urinary calculi: Secondary | ICD-10-CM | POA: Insufficient documentation

## 2012-10-14 DIAGNOSIS — Z3202 Encounter for pregnancy test, result negative: Secondary | ICD-10-CM | POA: Insufficient documentation

## 2012-10-14 LAB — URINALYSIS, ROUTINE W REFLEX MICROSCOPIC
Specific Gravity, Urine: 1.02 (ref 1.005–1.030)
Urobilinogen, UA: 0.2 mg/dL (ref 0.0–1.0)
pH: 7 (ref 5.0–8.0)

## 2012-10-14 LAB — PREGNANCY, URINE: Preg Test, Ur: NEGATIVE

## 2012-10-14 LAB — URINE MICROSCOPIC-ADD ON

## 2012-10-14 MED ORDER — IBUPROFEN 600 MG PO TABS: 600.0000 mg | ORAL_TABLET | Freq: Three times a day (TID) | ORAL | Status: DC | PRN

## 2012-10-14 MED ORDER — ACETAMINOPHEN 325 MG PO TABS: 650.0000 mg | ORAL_TABLET | Freq: Four times a day (QID) | ORAL | Status: DC | PRN

## 2012-10-14 MED ORDER — MORPHINE SULFATE 4 MG/ML IJ SOLN
6.0000 mg | Freq: Once | INTRAMUSCULAR | Status: AC
  Administered 2012-10-14: 6 mg via INTRAMUSCULAR
  Filled 2012-10-14: qty 2

## 2012-10-14 NOTE — ED Notes (Signed)
Pt presents with c/o pain in lower abdomin,  rt side flank pain, and burning with urination since awaking this morning. Pt reports PCP unable to see her today. Pt denies fever and emesis. NAD noted.

## 2012-10-14 NOTE — ED Provider Notes (Signed)
History     CSN: [id number]  Arrival date & time 10/14/12  1157   First MD Initiated Contact with Patient 10/14/12 1300      Chief Complaint  Patient presents with  . Urinary Tract Infection  . Back Pain     The history is provided by the patient.   is reports developing dysuria as well as some flank pain in the right side began today.  Her pain is mild to moderate in severity.  She states she has a history of kidney stones.  She's concerned that she saw blood in her urine this may have a kidney stone given her right flank pain with radiation to right groin.  No fever or vaginal discharge.  Nausea without vomiting.  No diarrhea.  Symptoms are mild to moderate in severity.  Nothing worsens or improves her symptoms.  Past Medical History  Diagnosis Date  . Migraine     Past Surgical History  Procedure Laterality Date  . Cesarean section    . Finger amputation      right pinky finger  . Tonsillectomy      Family History  Problem Relation Age of Onset  . Hypertension Mother   . Diabetes Father   . Heart failure Father     History  Substance Use Topics  . Smoking status: Never Smoker   . Smokeless tobacco: Never Used  . Alcohol Use: No    OB History   Grav Para Term Preterm Abortions TAB SAB Ect Mult Living   4 1 1  3  3   1       Review of Systems  All other systems reviewed and are negative.    Allergies  Tramadol; Mobic; Penicillins; and Sulfa antibiotics  Home Medications   Current Outpatient Rx  Name  Route  Sig  Dispense  Refill  . acetaminophen (TYLENOL) 325 MG tablet   Oral   Take 2 tablets (650 mg total) by mouth every 6 (six) hours as needed for pain.   30 tablet   0     BP 119/85  Pulse 100  Temp(Src) 98.4 F (36.9 C) (Oral)  Resp 18  Ht 4\' 11"  (1.499 m)  Wt 168 lb (76.204 kg)  BMI 33.91 kg/m2  SpO2 99%  LMP 09/17/2011  Physical Exam  Nursing note and vitals reviewed. Constitutional: She is oriented to person, place, and time.  She appears well-developed and well-nourished. No distress.  HENT:  Head: Normocephalic and atraumatic.  Eyes: EOM are normal.  Neck: Normal range of motion.  Cardiovascular: Normal rate, regular rhythm and normal heart sounds.   Pulmonary/Chest: Effort normal and breath sounds normal.  Abdominal: Soft. She exhibits no distension. There is no tenderness.  Musculoskeletal: Normal range of motion.  Neurological: She is alert and oriented to person, place, and time.  Skin: Skin is warm and dry.  Psychiatric: She has a normal mood and affect. Judgment normal.    ED Course  Procedures (including critical care time)  Labs Reviewed  URINALYSIS, ROUTINE W REFLEX MICROSCOPIC - Abnormal; Notable for the following:    Hgb urine dipstick LARGE (*)    All other components within normal limits  PREGNANCY, URINE  URINE MICROSCOPIC-ADD ON   US Renal  10/14/2012  *RADIOLOGY REPORT*  Clinical Data: Urinary tract infection, back pain  RENAL/URINARY TRACT ULTRASOUND COMPLETE  Comparison:  None.  Findings:  Right Kidney:  12.0 cm length.  Normal cortical thickness and echogenicity.  No mass, hydronephrosis or  shadowing calcification.  Left Kidney:  11.2 cm length.  Normal cortical thickness and echogenicity.  No mass, hydronephrosis or shadowing calcification.  Bladder:  Normal appearance.  IMPRESSION: Normal renal ultrasound.   Original Report Authenticated By: Ulyses Southward, M.D.    I personally reviewed the imaging tests through PACS system I reviewed available ER/hospitalization records through the EMR   1. Flank pain       MDM  Back pain is improved at this time.  No evidence of hydronephrosis.  No evidence of urinary tract infection.  Discharge home in good condition.  Urine culture sent.  Pain treated in ER.        Lyanne Co, MD 10/14/12 2219

## 2012-10-14 NOTE — ED Notes (Signed)
Pt reports painful urination  And low back pain that started this am. Denies any fever or vaginal discharge. +nausea at times.

## 2012-11-19 ENCOUNTER — Encounter (HOSPITAL_COMMUNITY): Payer: Self-pay | Admitting: *Deleted

## 2012-11-19 ENCOUNTER — Emergency Department (HOSPITAL_COMMUNITY)
Admission: EM | Admit: 2012-11-19 | Discharge: 2012-11-19 | Disposition: A | Discharge status: Home / Self Care | Payer: Medicaid Other | Attending: Emergency Medicine | Admitting: Emergency Medicine

## 2012-11-19 DIAGNOSIS — Z880 Allergy status to penicillin: Secondary | ICD-10-CM | POA: Insufficient documentation

## 2012-11-19 DIAGNOSIS — R112 Nausea with vomiting, unspecified: Secondary | ICD-10-CM | POA: Insufficient documentation

## 2012-11-19 DIAGNOSIS — G43909 Migraine, unspecified, not intractable, without status migrainosus: Secondary | ICD-10-CM | POA: Insufficient documentation

## 2012-11-19 MED ORDER — ONDANSETRON 4 MG PO TBDP
4.0000 mg | ORAL_TABLET | Freq: Once | ORAL | Status: AC
  Administered 2012-11-19: 4 mg via ORAL

## 2012-11-19 MED ORDER — ONDANSETRON 4 MG PO TBDP
ORAL_TABLET | ORAL | Status: AC
  Filled 2012-11-19: qty 1

## 2012-11-19 MED ORDER — DEXAMETHASONE SODIUM PHOSPHATE 4 MG/ML IJ SOLN
10.0000 mg | Freq: Once | INTRAMUSCULAR | Status: AC
  Administered 2012-11-19: 10 mg via INTRAVENOUS
  Filled 2012-11-19: qty 3

## 2012-11-19 MED ORDER — SODIUM CHLORIDE 0.9 % IV SOLN: INTRAVENOUS | Status: DC

## 2012-11-19 MED ORDER — DIPHENHYDRAMINE HCL 50 MG/ML IJ SOLN
25.0000 mg | Freq: Once | INTRAMUSCULAR | Status: AC
  Administered 2012-11-19: 25 mg via INTRAVENOUS
  Filled 2012-11-19: qty 1

## 2012-11-19 MED ORDER — HYDROMORPHONE HCL PF 1 MG/ML IJ SOLN
1.0000 mg | Freq: Once | INTRAMUSCULAR | Status: AC
  Administered 2012-11-19: 1 mg via INTRAVENOUS
  Filled 2012-11-19: qty 1

## 2012-11-19 MED ORDER — SODIUM CHLORIDE 0.9 % IV BOLUS (SEPSIS)
1000.0000 mL | Freq: Once | INTRAVENOUS | Status: AC
  Administered 2012-11-19: 1000 mL via INTRAVENOUS

## 2012-11-19 MED ORDER — BUTALBITAL-APAP-CAFFEINE 50-325-40 MG PO TABS: 1.0000 | ORAL_TABLET | Freq: Four times a day (QID) | ORAL | Status: DC | PRN

## 2012-11-19 MED ORDER — METOCLOPRAMIDE HCL 5 MG/ML IJ SOLN
10.0000 mg | Freq: Once | INTRAMUSCULAR | Status: AC
  Administered 2012-11-19: 10 mg via INTRAVENOUS
  Filled 2012-11-19: qty 2

## 2012-11-19 NOTE — ED Notes (Signed)
Migraine began at 1800 yesterday.  Has not taken any pain medication d/t nausea and vomiting associated w/headache.

## 2012-11-19 NOTE — ED Notes (Signed)
Cold rag applied to forehead.

## 2012-11-19 NOTE — ED Provider Notes (Signed)
History     CSN: [id number]  Arrival date & time 11/19/12  1625   First MD Initiated Contact with Patient 11/19/12 1710      Chief Complaint  Patient presents with  . Migraine    (Consider location/radiation/quality/duration/timing/severity/associated sxs/prior treatment) Patient is a 32 y.o. female presenting with migraines. The history is provided by the patient.  Migraine Associated symptoms include headaches. Pertinent negatives include no chest pain, no abdominal pain and no shortness of breath.   patient with typical migraine for her onset last evening at 6 PM. Associated with nausea and vomiting as throbbing left frontal area 10 out of 10. No other complaints. Headache not made worse or better by anything. Is associated with some light sensitivity.  Past Medical History  Diagnosis Date  . Migraine     Past Surgical History  Procedure Laterality Date  . Cesarean section    . Finger amputation      right pinky finger  . Tonsillectomy      Family History  Problem Relation Age of Onset  . Hypertension Mother   . Diabetes Father   . Heart failure Father     History  Substance Use Topics  . Smoking status: Never Smoker   . Smokeless tobacco: Never Used  . Alcohol Use: No    OB History   Grav Para Term Preterm Abortions TAB SAB Ect Mult Living   4 1 1  3  3   1       Review of Systems  Constitutional: Negative for fever.  HENT: Negative for neck stiffness.   Eyes: Positive for photophobia.  Respiratory: Negative for shortness of breath.   Cardiovascular: Negative for chest pain.  Gastrointestinal: Positive for nausea and vomiting. Negative for abdominal pain.  Genitourinary: Negative for dysuria.  Musculoskeletal: Negative for back pain.  Skin: Negative for rash.  Neurological: Positive for headaches.  Hematological: Does not bruise/bleed easily.  Psychiatric/Behavioral: Negative for confusion.    Allergies  Tramadol; Mobic; Penicillins; and Sulfa  antibiotics  Home Medications   Current Outpatient Rx  Name  Route  Sig  Dispense  Refill  . ibuprofen (ADVIL,MOTRIN) 200 MG tablet   Oral   Take 200 mg by mouth every 6 (six) hours as needed for pain.         . butalbital-acetaminophen-caffeine (FIORICET) 50-325-40 MG per tablet   Oral   Take 1-2 tablets by mouth every 6 (six) hours as needed for headache.   20 tablet   0     BP 129/89  Pulse 70  Temp(Src) 98.2 F (36.8 C) (Oral)  Resp 17  Ht 4\' 11"  (1.499 m)  Wt 168 lb (76.204 kg)  BMI 33.91 kg/m2  SpO2 97%  Physical Exam  Nursing note and vitals reviewed. Constitutional: She is oriented to person, place, and time. She appears well-developed and well-nourished. No distress.  HENT:  Head: Normocephalic and atraumatic.  Mouth/Throat: Oropharynx is clear and moist.  Eyes: Conjunctivae and EOM are normal. Pupils are equal, round, and reactive to light.  Neck: Normal range of motion. Neck supple.  Cardiovascular: Normal rate, regular rhythm and normal heart sounds.   No murmur heard. Pulmonary/Chest: Effort normal and breath sounds normal. No respiratory distress.  Abdominal: Soft. Bowel sounds are normal. There is no tenderness.  Musculoskeletal: Normal range of motion.  Neurological: She is alert and oriented to person, place, and time. No cranial nerve deficit. She exhibits normal muscle tone. Coordination normal.  Skin: Skin is warm.  ED Course  Procedures (including critical care time)  Labs Reviewed - No data to display No results found.   1. Migraine       MDM  Patient presents with headache consistent with typical migraine for for her period started at 6 PM yesterday. Associated with nausea and vomiting. No fever. Patient improved significantly here in the emergency department with IV Decadron Benadryl Reglan and hydromorphone. Patient's headache is him was completely gone it was a 10 out of 10 L1 to 2/10 feels much better.        Shelda Jakes, MD 11/19/12 [phone]

## 2013-04-04 ENCOUNTER — Emergency Department (HOSPITAL_COMMUNITY)
Admission: EM | Admit: 2013-04-04 | Discharge: 2013-04-04 | Disposition: A | Discharge status: Home / Self Care | Payer: Medicaid Other | Attending: Emergency Medicine | Admitting: Emergency Medicine

## 2013-04-04 ENCOUNTER — Encounter (HOSPITAL_COMMUNITY): Payer: Self-pay | Admitting: Emergency Medicine

## 2013-04-04 DIAGNOSIS — K0889 Other specified disorders of teeth and supporting structures: Secondary | ICD-10-CM

## 2013-04-04 DIAGNOSIS — Z8679 Personal history of other diseases of the circulatory system: Secondary | ICD-10-CM | POA: Insufficient documentation

## 2013-04-04 DIAGNOSIS — K089 Disorder of teeth and supporting structures, unspecified: Secondary | ICD-10-CM | POA: Insufficient documentation

## 2013-04-04 DIAGNOSIS — Z880 Allergy status to penicillin: Secondary | ICD-10-CM | POA: Insufficient documentation

## 2013-04-04 MED ORDER — CLINDAMYCIN HCL 150 MG PO CAPS
300.0000 mg | ORAL_CAPSULE | Freq: Once | ORAL | Status: AC
  Administered 2013-04-04: 300 mg via ORAL
  Filled 2013-04-04: qty 2

## 2013-04-04 MED ORDER — ACETAMINOPHEN-CODEINE #3 300-30 MG PO TABS: 1.0000 | ORAL_TABLET | ORAL | Status: DC | PRN

## 2013-04-04 MED ORDER — CLINDAMYCIN HCL 150 MG PO CAPS: ORAL_CAPSULE | ORAL | Status: DC

## 2013-04-04 MED ORDER — IBUPROFEN 800 MG PO TABS
800.0000 mg | ORAL_TABLET | Freq: Once | ORAL | Status: AC
  Administered 2013-04-04: 800 mg via ORAL
  Filled 2013-04-04: qty 1

## 2013-04-04 MED ORDER — ACETAMINOPHEN-CODEINE #3 300-30 MG PO TABS
2.0000 | ORAL_TABLET | Freq: Once | ORAL | Status: AC
  Administered 2013-04-04: 2 via ORAL
  Filled 2013-04-04: qty 2

## 2013-04-04 MED ORDER — ONDANSETRON HCL 4 MG PO TABS
4.0000 mg | ORAL_TABLET | Freq: Once | ORAL | Status: AC
  Administered 2013-04-04: 4 mg via ORAL
  Filled 2013-04-04: qty 1

## 2013-04-04 MED ORDER — DIPHENHYDRAMINE HCL 25 MG PO CAPS
25.0000 mg | ORAL_CAPSULE | Freq: Once | ORAL | Status: AC
  Administered 2013-04-04: 25 mg via ORAL
  Filled 2013-04-04: qty 1

## 2013-04-04 NOTE — ED Provider Notes (Signed)
CSN: [id number]     Arrival date & time 04/04/13  0015 History   First MD Initiated Contact with Patient 04/04/13 0026     Chief Complaint  Patient presents with  . Dental Pain   (Consider location/radiation/quality/duration/timing/severity/associated sxs/prior Treatment) Patient is a 32 y.o. female presenting with tooth pain. The history is provided by the patient.  Dental Pain Quality:  Throbbing Severity:  Severe Onset quality:  Sudden Timing:  Constant Progression:  Worsening Chronicity:  New Context: dental caries   Relieved by:  Nothing Worsened by:  Nothing tried Ineffective treatments:  None tried Associated symptoms: gum swelling and headaches   Associated symptoms: no fever and no neck pain     Past Medical History  Diagnosis Date  . Migraine    Past Surgical History  Procedure Laterality Date  . Cesarean section    . Finger amputation      right pinky finger  . Tonsillectomy     Family History  Problem Relation Age of Onset  . Hypertension Mother   . Diabetes Father   . Heart failure Father    History  Substance Use Topics  . Smoking status: Never Smoker   . Smokeless tobacco: Never Used  . Alcohol Use: No   OB History   Grav Para Term Preterm Abortions TAB SAB Ect Mult Living   4 1 1  3  3   1      Review of Systems  Constitutional: Negative for fever and activity change.       All ROS Neg except as noted in HPI  HENT: Negative for nosebleeds.   Eyes: Negative for photophobia and discharge.  Respiratory: Negative for cough, shortness of breath and wheezing.   Cardiovascular: Negative for chest pain and palpitations.  Gastrointestinal: Negative for abdominal pain and blood in stool.  Genitourinary: Negative for dysuria, frequency and hematuria.  Musculoskeletal: Negative for arthralgias, back pain and neck pain.  Skin: Negative.   Neurological: Positive for headaches. Negative for dizziness, seizures and speech difficulty.   Psychiatric/Behavioral: Negative for hallucinations and confusion.    Allergies  Tramadol; Mobic; Penicillins; and Sulfa antibiotics  Home Medications   Current Outpatient Rx  Name  Route  Sig  Dispense  Refill  . ibuprofen (ADVIL,MOTRIN) 200 MG tablet   Oral   Take 200 mg by mouth every 6 (six) hours as needed for pain.         . butalbital-acetaminophen-caffeine (FIORICET) 50-325-40 MG per tablet   Oral   Take 1-2 tablets by mouth every 6 (six) hours as needed for headache.   20 tablet   0    BP 129/86  Pulse 72  Temp(Src) 98.5 F (36.9 C) (Oral)  Resp 17  Ht 4\' 11"  (1.499 m)  Wt 168 lb (76.204 kg)  BMI 33.91 kg/m2  SpO2 98% Physical Exam  Nursing note and vitals reviewed. Constitutional: She is oriented to person, place, and time. She appears well-developed and well-nourished.  Non-toxic appearance.  HENT:  Head: Normocephalic.  Right Ear: Tympanic membrane and external ear normal.  Left Ear: Tympanic membrane and external ear normal.  There is a portion of the right upper second molar missing. There is evidence of a cavity present. There is mild redness and mild swelling involving the upper gum. There is no visible abscess appreciated. The airway is patent. There is no swelling under the tongue.  Eyes: EOM and lids are normal. Pupils are equal, round, and reactive to light.  Neck: Normal  range of motion. Neck supple. Carotid bruit is not present.  Cardiovascular: Normal rate, regular rhythm, normal heart sounds, intact distal pulses and normal pulses.   Pulmonary/Chest: Breath sounds normal. No respiratory distress.  Abdominal: Soft. Bowel sounds are normal. There is no tenderness. There is no guarding.  Musculoskeletal: Normal range of motion.  Lymphadenopathy:       Head (right side): No submandibular adenopathy present.       Head (left side): No submandibular adenopathy present.    She has no cervical adenopathy.  Neurological: She is alert and oriented to  person, place, and time. She has normal strength. No cranial nerve deficit or sensory deficit.  Skin: Skin is warm and dry.  Psychiatric: She has a normal mood and affect. Her speech is normal.    ED Course  Procedures (including critical care time) Labs Review Labs Reviewed - No data to display Imaging Review No results found.  EKG Interpretation   None       MDM  No diagnosis found. *I have reviewed nursing notes, vital signs, and all appropriate lab and imaging results for this patient.**  6 or 7 min after taking cleocin, zofran, tylenol-codeine, and ibuprofen, pt states she is itching across her chest.  Benadryl 25mg  given to the patient. Patient left the ER before being assessed concerning her itching. Patient was ambulatory without problem, conversing with her guess without problem per nursing staff.  Kathie Dike, PA-C 04/04/13 1640

## 2013-04-04 NOTE — ED Notes (Signed)
Pt c/o rt sided dental pain after one of her teeth broke tonight.

## 2013-04-05 NOTE — ED Provider Notes (Signed)
Medical screening examination/treatment/procedure(s) were performed by non-physician practitioner and as supervising physician I was immediately available for consultation/collaboration.   Dione Booze, MD 04/05/13 [phone]

## 2013-06-30 ENCOUNTER — Encounter (HOSPITAL_COMMUNITY): Payer: Self-pay | Admitting: Emergency Medicine

## 2013-06-30 ENCOUNTER — Emergency Department (HOSPITAL_COMMUNITY)
Admission: EM | Admit: 2013-06-30 | Discharge: 2013-06-30 | Disposition: A | Discharge status: Home / Self Care | Payer: Medicaid - Out of State | Attending: Emergency Medicine | Admitting: Emergency Medicine

## 2013-06-30 DIAGNOSIS — K0889 Other specified disorders of teeth and supporting structures: Secondary | ICD-10-CM

## 2013-06-30 DIAGNOSIS — Z8679 Personal history of other diseases of the circulatory system: Secondary | ICD-10-CM | POA: Insufficient documentation

## 2013-06-30 DIAGNOSIS — R220 Localized swelling, mass and lump, head: Secondary | ICD-10-CM | POA: Insufficient documentation

## 2013-06-30 DIAGNOSIS — Z880 Allergy status to penicillin: Secondary | ICD-10-CM | POA: Insufficient documentation

## 2013-06-30 DIAGNOSIS — K0381 Cracked tooth: Secondary | ICD-10-CM | POA: Insufficient documentation

## 2013-06-30 DIAGNOSIS — K029 Dental caries, unspecified: Secondary | ICD-10-CM | POA: Insufficient documentation

## 2013-06-30 DIAGNOSIS — K089 Disorder of teeth and supporting structures, unspecified: Secondary | ICD-10-CM | POA: Insufficient documentation

## 2013-06-30 DIAGNOSIS — R51 Headache: Secondary | ICD-10-CM | POA: Insufficient documentation

## 2013-06-30 DIAGNOSIS — R221 Localized swelling, mass and lump, neck: Secondary | ICD-10-CM

## 2013-06-30 DIAGNOSIS — Z8742 Personal history of other diseases of the female genital tract: Secondary | ICD-10-CM | POA: Insufficient documentation

## 2013-06-30 HISTORY — DX: Polycystic ovarian syndrome: E28.2

## 2013-06-30 MED ORDER — ONDANSETRON HCL 4 MG PO TABS
4.0000 mg | ORAL_TABLET | Freq: Once | ORAL | Status: AC
  Administered 2013-06-30: 4 mg via ORAL
  Filled 2013-06-30: qty 1

## 2013-06-30 MED ORDER — CIPROFLOXACIN HCL 500 MG PO TABS: 500.0000 mg | ORAL_TABLET | Freq: Two times a day (BID) | ORAL | Status: DC

## 2013-06-30 MED ORDER — ACETAMINOPHEN 500 MG PO TABS
1000.0000 mg | ORAL_TABLET | Freq: Once | ORAL | Status: AC
  Administered 2013-06-30: 975 mg via ORAL

## 2013-06-30 MED ORDER — ACETAMINOPHEN 325 MG PO TABS
ORAL_TABLET | ORAL | Status: AC
  Administered 2013-06-30: 13:00:00
  Filled 2013-06-30: qty 3

## 2013-06-30 MED ORDER — CIPROFLOXACIN HCL 250 MG PO TABS
500.0000 mg | ORAL_TABLET | Freq: Once | ORAL | Status: AC
  Administered 2013-06-30: 500 mg via ORAL
  Filled 2013-06-30: qty 2

## 2013-06-30 NOTE — ED Provider Notes (Signed)
Medical screening examination/treatment/procedure(s) were performed by non-physician practitioner and as supervising physician I was immediately available for consultation/collaboration.  EKG Interpretation   None       Devoria AlbeIva Larine Fielding, MD, Armando GangFACEP   Ward GivensIva L Thelbert Gartin, MD 06/30/13 1257

## 2013-06-30 NOTE — Discharge Instructions (Signed)
Toothache  Toothaches are usually caused by tooth decay (cavity). However, other causes of toothache include:  · Gum disease.  · Cracked tooth.  · Cracked filling.  · Injury.  · Jaw problem (temporo mandibular joint or TMJ disorder).  · Tooth abscess.  · Root sensitivity.  · Grinding.  · Eruption problems.  Swelling and redness around a painful tooth often means you have a dental abscess.  Pain medicine and antibiotics can help reduce symptoms, but you will need to see a dentist within the next few days to have your problem properly evaluated and treated. If tooth decay is the problem, you may need a filling or root canal to save your tooth. If the problem is more severe, your tooth may need to be pulled.  SEEK IMMEDIATE MEDICAL CARE IF:  · You cannot swallow.  · You develop severe swelling, increased redness, or increased pain in your mouth or face.  · You have a fever.  · You cannot open your mouth adequately.  Document Released: 06/29/2004 Document Revised: 08/14/2011 Document Reviewed: 08/19/2009  [employer]® Patient Information ©2014 [employer].

## 2013-06-30 NOTE — ED Notes (Signed)
Pain lt upper tooth, says she broke the tooth in past, but another part of tooth came off and began to have pain.

## 2013-06-30 NOTE — ED Notes (Signed)
Broken tooth lt upper tooth

## 2013-06-30 NOTE — ED Provider Notes (Signed)
CSN: [id number]     Arrival date & time 06/30/13  1103 History   First MD Initiated Contact with Patient 06/30/13 1215     Chief Complaint  Patient presents with  . Dental Pain   (Consider location/radiation/quality/duration/timing/severity/associated sxs/prior Treatment) Patient is a 33 y.o. female presenting with tooth pain. The history is provided by the patient.  Dental Pain Location:  Upper Quality:  Throbbing Severity:  Severe Onset quality:  Sudden Duration:  4 hours Timing:  Constant Progression:  Worsening Chronicity: acute on chronic. Context: poor dentition   Relieved by:  Nothing Worsened by:  Cold food/drink Ineffective treatments:  NSAIDs Associated symptoms: gum swelling and headaches   Associated symptoms: no fever and no neck pain   Risk factors: no smoking     Past Medical History  Diagnosis Date  . Migraine   . Polycystic ovarian syndrome    Past Surgical History  Procedure Laterality Date  . Cesarean section    . Finger amputation      right pinky finger  . Tonsillectomy     Family History  Problem Relation Age of Onset  . Hypertension Mother   . Diabetes Father   . Heart failure Father    History  Substance Use Topics  . Smoking status: Never Smoker   . Smokeless tobacco: Never Used  . Alcohol Use: No   OB History   Grav Para Term Preterm Abortions TAB SAB Ect Mult Living   4 1 1  3  3   1      Review of Systems  Constitutional: Negative for fever and activity change.       All ROS Neg except as noted in HPI  HENT: Positive for dental problem. Negative for nosebleeds.   Eyes: Negative for photophobia and discharge.  Respiratory: Negative for cough, shortness of breath and wheezing.   Cardiovascular: Negative for chest pain and palpitations.  Gastrointestinal: Negative for abdominal pain and blood in stool.  Genitourinary: Negative for dysuria, frequency and hematuria.  Musculoskeletal: Negative for arthralgias, back pain and neck  pain.  Skin: Negative.   Neurological: Positive for headaches. Negative for dizziness, seizures and speech difficulty.  Psychiatric/Behavioral: Negative for hallucinations and confusion.    Allergies  Tramadol; Mobic; Penicillins; and Sulfa antibiotics  Home Medications   Current Outpatient Rx  Name  Route  Sig  Dispense  Refill  . ibuprofen (ADVIL,MOTRIN) 200 MG tablet   Oral   Take 400 mg by mouth every 6 (six) hours as needed for moderate pain.           BP 142/79  Pulse 63  Temp(Src) 98.3 F (36.8 C) (Oral)  Resp 18  Ht 4\' 11"  (1.499 m)  Wt 168 lb (76.204 kg)  BMI 33.91 kg/m2  SpO2 100%  LMP 04/30/2013 Physical Exam  Nursing note and vitals reviewed. Constitutional: She is oriented to person, place, and time. She appears well-developed and well-nourished.  Non-toxic appearance.  HENT:  Head: Normocephalic.  Right Ear: Tympanic membrane and external ear normal.  Left Ear: Tympanic membrane and external ear normal.  Cavity of tooth #12 with some fracture. No abscess noted. Air way patent. No swelling under the tongue.  Eyes: EOM and lids are normal. Pupils are equal, round, and reactive to light.  Neck: Normal range of motion. Neck supple. Carotid bruit is not present.  Cardiovascular: Normal rate, regular rhythm, normal heart sounds, intact distal pulses and normal pulses.   Pulmonary/Chest: Breath sounds normal. No respiratory distress.  Abdominal: Soft. Bowel sounds are normal. There is no tenderness. There is no guarding.  Musculoskeletal: Normal range of motion.  Lymphadenopathy:       Head (right side): No submandibular adenopathy present.       Head (left side): No submandibular adenopathy present.    She has no cervical adenopathy.  Neurological: She is alert and oriented to person, place, and time. She has normal strength. No cranial nerve deficit or sensory deficit.  Skin: Skin is warm and dry.  Psychiatric: She has a normal mood and affect. Her speech is  normal.    ED Course  Procedures (including critical care time) Labs Review Labs Reviewed - No data to display Imaging Review No results found.  EKG Interpretation   None       MDM  No diagnosis found. **I have reviewed nursing notes, vital signs, and all appropriate lab and imaging results for this patient.*  Patient has had problems with her cavities for over a month. The patient had a small piece of the tooth cough on yesterday and has been having severe pain since that time. The patient states she has spoken with the dentists, and they cannot see her until next week. They advised her to come to the emergency department for antibiotics. Prescription for Cipro given to the patient. Patient advised to see the dentist as sone as possible.  Kathie DikeHobson M Jaceon Heiberger, PA-C 06/30/13 1243

## 2013-09-17 ENCOUNTER — Encounter (HOSPITAL_COMMUNITY): Payer: Self-pay | Admitting: Emergency Medicine

## 2013-09-17 ENCOUNTER — Emergency Department (HOSPITAL_COMMUNITY): Payer: Medicaid Other

## 2013-09-17 ENCOUNTER — Emergency Department (HOSPITAL_COMMUNITY)
Admission: EM | Admit: 2013-09-17 | Discharge: 2013-09-17 | Disposition: A | Discharge status: Home / Self Care | Payer: Medicaid Other | Attending: Emergency Medicine | Admitting: Emergency Medicine

## 2013-09-17 DIAGNOSIS — M25469 Effusion, unspecified knee: Secondary | ICD-10-CM | POA: Insufficient documentation

## 2013-09-17 DIAGNOSIS — Z882 Allergy status to sulfonamides status: Secondary | ICD-10-CM | POA: Insufficient documentation

## 2013-09-17 DIAGNOSIS — Z886 Allergy status to analgesic agent status: Secondary | ICD-10-CM | POA: Insufficient documentation

## 2013-09-17 DIAGNOSIS — G43909 Migraine, unspecified, not intractable, without status migrainosus: Secondary | ICD-10-CM | POA: Insufficient documentation

## 2013-09-17 DIAGNOSIS — M25562 Pain in left knee: Secondary | ICD-10-CM

## 2013-09-17 DIAGNOSIS — M25569 Pain in unspecified knee: Secondary | ICD-10-CM | POA: Insufficient documentation

## 2013-09-17 DIAGNOSIS — Z888 Allergy status to other drugs, medicaments and biological substances status: Secondary | ICD-10-CM | POA: Insufficient documentation

## 2013-09-17 DIAGNOSIS — Z880 Allergy status to penicillin: Secondary | ICD-10-CM | POA: Insufficient documentation

## 2013-09-17 DIAGNOSIS — E282 Polycystic ovarian syndrome: Secondary | ICD-10-CM | POA: Insufficient documentation

## 2013-09-17 MED ORDER — ACETAMINOPHEN-CODEINE #3 300-30 MG PO TABS
1.0000 | ORAL_TABLET | Freq: Once | ORAL | Status: DC
  Filled 2013-09-17: qty 1

## 2013-09-17 MED ORDER — ACETAMINOPHEN 325 MG PO TABS: 325.0000 mg | ORAL_TABLET | Freq: Four times a day (QID) | ORAL | Status: DC | PRN

## 2013-09-17 MED ORDER — PREDNISONE 20 MG PO TABS
60.0000 mg | ORAL_TABLET | Freq: Once | ORAL | Status: AC
  Administered 2013-09-17: 60 mg via ORAL
  Filled 2013-09-17: qty 3

## 2013-09-17 NOTE — Progress Notes (Signed)
Orthopedic Tech Progress Note Patient Details:  Leslie Larson 1980/08/04 [id number]  Ortho Devices Type of Ortho Device: Knee Sleeve;Crutches Ortho Device/Splint Interventions: Application   Mickie BailJennifer Carol Cammer 09/17/2013, 1:02 PM

## 2013-09-17 NOTE — ED Notes (Addendum)
Pt reports L knee pain and "popping" for a couple of days.  Pt states it's worse in the mornings when she gets up.  Pt denies injury/trauma.

## 2013-09-17 NOTE — Discharge Instructions (Signed)
Please call your doctor for a followup appointment within 24-48 hours. When you talk to your doctor please let them know that you were seen in the emergency department and have them acquire all of your records so that they can discuss the findings with you and formulate a treatment plan to fully care for your new and ongoing problems. Please call and set-up an appointment with your primary care provider to be re-assessed within the next 24-48 hours Please call and set-up an appointment with orthopedics Please apply heat to the day to aid in comfort Please rest and stay hydrated Please take medications as prescribed Please avoid any physical or strenuous activity Please keep sleeve on and use crutches  Please continue to monitor symptoms closely and if symptoms are to worsen or change (fever greater than 101, chills, chest pain, shortness of breath, difficulty breathing, fall, injury, numbness, tingling, worsening pain, swelling, to the touch, redness to the knee, inability to apply new pressure) please report back to the ED immediately   Arthralgia Your caregiver has diagnosed you as suffering from an arthralgia. Arthralgia means there is pain in a joint. This can come from many reasons including:  Bruising the joint which causes soreness (inflammation) in the joint.  Wear and tear on the joints which occur as we grow older (osteoarthritis).  Overusing the joint.  Various forms of arthritis.  Infections of the joint. Regardless of the cause of pain in your joint, most of these different pains respond to anti-inflammatory drugs and rest. The exception to this is when a joint is infected, and these cases are treated with antibiotics, if it is a bacterial infection. HOME CARE INSTRUCTIONS   Rest the injured area for as long as directed by your caregiver. Then slowly start using the joint as directed by your caregiver and as the pain allows. Crutches as directed may be useful if the ankles,  knees or hips are involved. If the knee was splinted or casted, continue use and care as directed. If an stretchy or elastic wrapping bandage has been applied today, it should be removed and re-applied every 3 to 4 hours. It should not be applied tightly, but firmly enough to keep swelling down. Watch toes and feet for swelling, bluish discoloration, coldness, numbness or excessive pain. If any of these problems (symptoms) occur, remove the ace bandage and re-apply more loosely. If these symptoms persist, contact your caregiver or return to this location.  For the first 24 hours, keep the injured extremity elevated on pillows while lying down.  Apply ice for 15-20 minutes to the sore joint every couple hours while awake for the first half day. Then 03-04 times per day for the first 48 hours. Put the ice in a plastic bag and place a towel between the bag of ice and your skin.  Wear any splinting, casting, elastic bandage applications, or slings as instructed.  Only take over-the-counter or prescription medicines for pain, discomfort, or fever as directed by your caregiver. Do not use aspirin immediately after the injury unless instructed by your physician. Aspirin can cause increased bleeding and bruising of the tissues.  If you were given crutches, continue to use them as instructed and do not resume weight bearing on the sore joint until instructed. Persistent pain and inability to use the sore joint as directed for more than 2 to 3 days are warning signs indicating that you should see a caregiver for a follow-up visit as soon as possible. Initially, a hairline  fracture (break in bone) may not be evident on X-rays. Persistent pain and swelling indicate that further evaluation, non-weight bearing or use of the joint (use of crutches or slings as instructed), or further X-rays are indicated. X-rays may sometimes not show a small fracture until a week or 10 days later. Make a follow-up appointment with your  own caregiver or one to whom we have referred you. A radiologist (specialist in reading X-rays) may read your X-rays. Make sure you know how you are to obtain your X-ray results. Do not assume everything is normal if you do not hear from [place]. SEEK MEDICAL CARE IF: Bruising, swelling, or pain increases. SEEK IMMEDIATE MEDICAL CARE IF:   Your fingers or toes are numb or blue.  The pain is not responding to medications and continues to stay the same or get worse.  The pain in your joint becomes severe.  You develop a fever over 102 F (38.9 C).  It becomes impossible to move or use the joint. MAKE SURE YOU:   Understand these instructions.  Will watch your condition.  Will get help right away if you are not doing well or get worse. Document Released: 05/22/2005 Document Revised: 08/14/2011 Document Reviewed: 01/08/2008 [facility] Patient Information 2014 [place], [place].   Emergency Department Resource Guide 1) Find a Doctor and Pay Out of Pocket Although you won't have to find out who is covered by your insurance plan, it is a good idea to ask around and get recommendations. You will then need to call the office and see if the doctor you have chosen will accept you as a new patient and what types of options they offer for patients who are self-pay. Some doctors offer discounts or will set up payment plans for their patients who do not have insurance, but you will need to ask so you aren't surprised when you get to your appointment.  2) Contact Your Local Health Department Not all health departments have doctors that can see patients for sick visits, but many do, so it is worth a call to see if yours does. If you don't know where your local health department is, you can check in your phone book. The CDC also has a tool to help you locate your state's health department, and many state websites also have listings of all of their local health departments.  3) Find a Walk-in Clinic If your  illness is not likely to be very severe or complicated, you may want to try a walk in clinic. These are popping up all over the country in pharmacies, drugstores, and shopping centers. They're usually staffed by nurse practitioners or physician assistants that have been trained to treat common illnesses and complaints. They're usually fairly quick and inexpensive. However, if you have serious medical issues or chronic medical problems, these are probably not your best option.  No Primary Care Doctor: - Call Health Connect at  [phone] - they can help you locate a primary care doctor that  accepts your insurance, provides certain services, etc. - Physician Referral Service- [phone]  Chronic Pain Problems: Organization         Address  Phone   Notes  [facility]  [phone] Patients need to be referred by their primary care doctor.   Medication Assistance: Organization         Address  Phone   Notes  [facility] Medication [facility] [address] [place] [place], [place] [address] [phone] --Must  be a resident of [facility] [place] -- Must have NO insurance coverage whatsoever (no Medicaid/ Medicare, etc.) -- The pt. MUST have a primary care doctor that directs their care regularly and follows them in the community   MedAssist  [phone]   [employer]  [phone]    Agencies that provide inexpensive medical care: Organization         Address  Phone   Notes  [facility]  [phone]   [facility]    [phone]   [facility] [address] [place], [place] [address] [phone]   [facility] [address], [place] [phone]) [phone]   Planned Parenthood    [phone]   [facility]    [phone]) [phone]   [facility]  [address], [place] Phone:  [phone]) [phone], Fax:  [phone] Hours of Operation:   9 am - 6 pm, M-F.  Also accepts Medicaid/Medicare and self-pay.  [facility]  [address], [place] Phone: [phone], Fax: [phone]) [phone]. Hours of Operation:  8:30 am - 5:30 pm, M-F.  Also accepts Medicaid and self-pay.  [facility] [place] [address], [place] Phone: [phone]) [phone]   [facility] [address], [place], [place] [phone], Ext. [phone] Mondays & Thursdays: 7-9 AM.  First 15 patients are seen on a first come, first serve basis.    Medicaid-accepting [facility] Providers:  Organization         Address  Phone   Notes  [facility] [address], [place] [phone] Also accepts self-pay patients.  [facility] [address], [place]  [phone]) [phone]   [facility] [address], [place] [phone]) [phone]   [facility] [address] [place] [address], [place] [phone]   Renaye RakersVeita Bland [address], [place]   [phone] Only accepts [place] Access [place] patients after they have their name applied to their card.   Self-Pay (no insurance) in [facility] [place] [facility] [place]:  Organization         Address  Phone   Notes  Sickle Cell Patients, [facility] [address], [place] [phone]) [phone]   [facility] [address], [place] [phone]) [phone]   [facility] [place]  [address], [place] [phone]) [phone]   [facility]/Dr. Osei-Bonsu  [address] [place] [address], [place] or [phone], [place] [phone] Phone number for both [place] and [place] locations is the same.  [facility] [address], [place] [phone]   [facility] [place] [address] [place] [address], [place] or [address] [phone]) [phone]   [facility] [address], [place] [phone]) [phone], phone; [phone], fax Sees patients 1st and 3rd Saturday of every month.  Must not qualify for public or private insurance (i.e. Medicaid, Medicare, [place] Health Choice, Veterans' Benefits)  Household income should be no more than 200% of the poverty level The clinic cannot treat you if you are pregnant or think you are pregnant  Sexually transmitted diseases are not treated at the clinic.    Dental Care: Organization  Address  Phone  Notes  [facility] [place], [place] [phone] Accepts children up to age 51 who are enrolled in [place] or [place]; pregnant women with a Medicaid card; and children who have applied for Medicaid or [place] Health Choice, but were declined, whose parents can pay a reduced fee at time of service.  [facility] of [facility]  [address], [place] [phone] Accepts children up to age 90 who are enrolled in [place] or [place]; pregnant women with a Medicaid card; and children who have applied for Medicaid or [place] Health Choice, but were declined, whose parents can pay a reduced fee at time of service.  [facility] Adult [facility] Access PROGRAM  [place] [phone] Patients are seen by appointment only. Walk-ins are not accepted. [place] will see patients 90 years of age and older. Monday - Tuesday (8am-5pm) Most Wednesdays (8:30-5pm) $30 per visit, cash only  [facility] Adult Dental Access PROGRAM  [address], [facility] [phone] Patients are seen by appointment only. Walk-ins are not accepted. [place] will see patients 78 years of age and older. One Wednesday Evening (Monthly: Volunteer Based).  $30 per visit, cash only  [place]  [phone] for adults; Children under age 26, call Graduate Pediatric Dentistry at  [phone]. Children aged 107-14, please call [phone] to request a pediatric application.  Dental services are provided in all areas of dental care including fillings, crowns and bridges, complete and partial dentures, implants, gum treatment, root canals, and extractions. Preventive care is also provided. Treatment is provided to both adults and children. Patients are selected via a lottery and there is often a waiting list.   [facility] [address] [place]  [phone] [email or web address]   [facility] [address] [place], [place] [phone], Ext. [phone] Second and Fourth Thursday of each month, opens at 6:30 AM; Clinic ends at 9 AM.  Patients are seen on a first-come first-served basis, and a limited number are seen during each clinic.   [facility]  [address]ard Danker [place], [place] [phone]   Eligibility Requirements You must have lived in [place], [place], or [place] counties for at least the last three months.   You cannot be eligible for state or federal sponsored [employer], including [employer], [place], or [employer].   You generally cannot be eligible for healthcare insurance through your employer.    How to apply: Eligibility screenings are held every Tuesday and Wednesday afternoon from 1:00 pm until 4:00 pm. You do not need an appointment for the interview!  [facility] [address] [place], [place]   [place]  [place] Department  [place]  [phone]    Behavioral Health Resources in the Community: Intensive Outpatient Programs Organization         Address  Phone  Notes  [place]. [address] [place], [place] [phone]   [facility] Outpatient [address] [place], [place]   ADS: Alcohol &  Drug Svcs [address], [place], [place]   [place] [address]  [place], [place] or [phone]   Substance Abuse Resources  Organization         Address  Phone  Notes  Alcohol and Drug Services  [phone]   [facility]  [phone]   [facility]  [phone]   Floydene Flock  [phone]   Residential & Outpatient Substance Abuse Program  [phone]   Psychological Services Organization         Address  Phone  Notes  [facility] Behavioral Health  [phone]   [facility] Services  [phone]   [facility] Mental Health [address], [place] [phone] or [phone]    Mobile Crisis Teams Organization         Address  Phone  Notes  [facility], Mobile Crisis Care Unit  [phone]   [facility]  [address] [place], [place] [phone]   Doristine Locks [address] [place] [phone]    Self-Help/Support Groups Organization         Address  Phone             Notes  [facility] of [place] - variety of support groups  [phone]- I7437963 Call for more information  Narcotics Anonymous (NA), [facility] [address], [employer] [place]  2 meetings at this location   Statistician         Address  Phone  Notes  [facility] [address],    [place]  [phone]   [facility]  [address] [place] [address]62, [place], [place] [phone]   [facility] Treatment Facility [address] [place], [place] [phone] Admissions: 8am-3pm M-F  [facility] [address]    [place], [place] [phone]   [facility] [address] [place], [place], [place] [phone]   [facility] [address]  [place], [place] [phone]   [facility] - Intensive Outpatient [address], [place], [place]  [phone]   [facility] ([facility] [address] [place].,  [place], [place] [phone] or [phone]   [facility] (RTS) [address], [place], [place] [phone] Accepts Medicaid  [facility] [address]  [place] [phone] Substance Abuse/Addiction Treatment   [facility] Organization         Address  Phone  Notes  [facility]  [phone]   Angie Fava, PhD [address] [place], [place]   [phone] or [phone]   [facility] Behavioral   [address] [place], [place] [phone]   [facility] [address] [place], [place] [phone] Insurance/Medicaid/sponsorship through [facility] [address]                                    [place], [place] [phone] Therapy/tele-psych/case  [facility] [address], [place] [phone]    Dr. Lolly Mustache  [phone]   [facility]  [employer] [facility] Dept. 1) [address] [place] 2) [address], [place] 3)  [address], [place] [phone]  [phone]   [facility] Child Abuse Hotline ([phone] or [phone] (After Hours)

## 2013-09-17 NOTE — ED Provider Notes (Signed)
CSN: [id number]     Arrival date & time 09/17/13  1033 History  This chart was scribed for Raymon Mutton, PA, working with Benny Lennert, MD, by Ardelia Mems ED Scribe. This patient was seen in room TR10C/TR10C and the patient's care was started at 12:06 AM.   Chief Complaint  Patient presents with  . Knee Pain    The history is provided by the patient. No language interpreter was used.    HPI Comments: Leslie Larson is a 33 y.o. female who presents to the Emergency Department complaining of intermittent, moderate left knee pain with associated swelling for the past 6 months that worsened and became constant over the past 2 days. She states that her pain is worsened with walking. She describes her pain over the past 2 days as being throbbing constantly with intermittent sharp pain. She also states that her knee has felt "stiff" the past few mornings. She states that her pain does not radiate, although she has had intermittent paresthesias in the toes of her left foot. She denies any recent falls or injuries, although reports a remote history of spraining her left knee years ago. She states that she has tried Aleve without relief of her pain. She denies numbness or loss of sensation.  PCP- Dr. Lia Hopping   Past Medical History  Diagnosis Date  . Migraine   . Polycystic ovarian syndrome    Past Surgical History  Procedure Laterality Date  . Cesarean section    . Finger amputation      right pinky finger  . Tonsillectomy     Family History  Problem Relation Age of Onset  . Hypertension Mother   . Diabetes Father   . Heart failure Father    History  Substance Use Topics  . Smoking status: Never Smoker   . Smokeless tobacco: Never Used  . Alcohol Use: No   OB History   Grav Para Term Preterm Abortions TAB SAB Ect Mult Living   4 1 1  3  3   1      Review of Systems  Musculoskeletal: Positive for arthralgias (left knee) and joint swelling (left knee).  Neurological:  Negative for numbness.  All other systems reviewed and are negative.   Allergies  Tramadol; Mobic; Penicillins; and Sulfa antibiotics  Home Medications   Prior to Admission medications   Medication Sig Start Date End Date Taking? Authorizing Provider  ciprofloxacin (CIPRO) 500 MG tablet Take 1 tablet (500 mg total) by mouth 2 (two) times daily. 06/30/13   Kathie Dike, PA-C  ibuprofen (ADVIL,MOTRIN) 200 MG tablet Take 400 mg by mouth every 6 (six) hours as needed for moderate pain.     Historical Provider, MD   Triage Vitals: BP 126/80  Pulse 77  Temp(Src) 98.3 F (36.8 C) (Oral)  Resp 15  Ht 4\' 11"  (1.499 m)  Wt 168 lb (76.204 kg)  BMI 33.91 kg/m2  SpO2 99%  Physical Exam  Nursing note and vitals reviewed. Constitutional: She is oriented to person, place, and time. She appears well-developed and well-nourished. No distress.  HENT:  Head: Normocephalic.  Eyes: Conjunctivae and EOM are normal. Right eye exhibits no discharge. Left eye exhibits no discharge.  Neck: Normal range of motion. Neck supple. No tracheal deviation present.  Cardiovascular: Normal rate, regular rhythm and normal heart sounds.  Exam reveals no friction rub.   No murmur heard. Pulmonary/Chest: Effort normal and breath sounds normal. No respiratory distress. She has no wheezes. She  has no rales.  Musculoskeletal: Normal range of motion. She exhibits tenderness.       Legs: Negative swelling, erythema, inflammation, lesions, sores, ecchymosis, deformities noted to the left knee. Negative crepitus upon palpation with motion. Mild discomfort upon palpation to the lateral aspect of the left knee. Negative barriers and valgus tension. Negative anterior and posterior draw sign. Stable left knee joint.  Lymphadenopathy:    She has no cervical adenopathy.  Neurological: She is alert and oriented to person, place, and time. No cranial nerve deficit. She exhibits normal muscle tone. Coordination normal.  Cranial  nerves III-XII grossly intact Strength 5+/5+ to upper and lower extremities bilaterally with resistance applied, equal distribution noted Sensation intact with differentiation to sharp and dull touch  Negative arm drift Fine motor skills intact Heel to knee down shin normal bilaterally Gait proper, proper balance - negative sway, negative drift, negative step-offs  Skin: Skin is warm and dry. No rash noted. She is not diaphoretic. No erythema.  Psychiatric: She has a normal mood and affect. Her behavior is normal. Thought content normal.    ED Course  Procedures (including critical care time)  DIAGNOSTIC STUDIES: Oxygen Saturation is 99% on RA, normal by my interpretation.    COORDINATION OF CARE: 12:13 PM- Discussed radiology findings. Pt advised of plan for treatment and pt agrees.  Labs Review Labs Reviewed - No data to display  Imaging Review Dg Knee 2 Views Left  09/17/2013   CLINICAL DATA:  Left knee pain for several months worsening over the preceding 2 days without known injury  EXAM: LEFT KNEE - 1-2 VIEW  COMPARISON:  None.  FINDINGS: AP and lateral views of the left knee reveal the bones to be adequately mineralized. There is mild beaking of the medial tibial spine. The joint spaces are reasonably well maintained on this nonweightbearing series. No joint effusion is evident. There are no abnormal soft tissue calcifications.  IMPRESSION: There is no acute bony abnormality of the left knee. Minimal degenerative beaking of the medial tibial spine is present.   Electronically Signed   By: David  Swazil[place]   On: 09/17/2013 11:42     EKG Interpretation None      MDM   Final diagnoses:  Left knee pain   Medications  predniSONE (DELTASONE) tablet 60 mg (60 mg Oral Given 09/17/13 1249)   Filed Vitals:   09/17/13 1049 09/17/13 1251  BP: 126/80 133/86  Pulse: 77 78  Temp: 98.3 F (36.8 C)   TempSrc: Oral   Resp: 15 18  Height: 4\' 11"  (1.499 m)   Weight: 168 lb (76.204 kg)    SpO2: 99% 97%    I personally performed the services described in this documentation, which was scribed in my presence. The recorded information has been reviewed and is accurate.  Patient presenting to the ED with left knee pain has been ongoing for the past 6 months with worsening within the past 2 days described as a sharp pain that is localized to the lateral aspect of the left knee without radiation. Stated that in the mornings there is a stiffening sensation where she has to warm up her leg before applying pressure. Reported that when she walks at times she hears a popping sensation. Stated that she's noticed some swelling to the knee. Denied fall, direct trauma, injury, numbness, tingling, loss of sensation. Alert and oriented. GCS 15. Heart rate and rhythm normal. Lungs clear to auscultation to upper and lower lobes bilaterally. Radial and DP  pulses 2+ bilaterally. Cap refill less than 3 seconds. Negative swelling identified to the left knee. Negative deformities, erythema, inflammation, warmth upon palpation, ecchymosis identified. Discomfort upon palpation to the lateral aspect of the left knee along the margin. Negative valgus or varus tension. Negative posterior and anterior draw sign. Stable left knee joint. Negative crepitus upon motion. Plain film of the left knee identified no acute bony abnormality, minimal degenerative breaking of the medial tibial spine is present. Doubt septic joint. Doubt gout. Doubt tibial plateau fracture-no sign of trauma or history of injury to the knee. Suspicion to be chronic issue since patient is been ongoing for 6 months, beginning of osteoarthritis. Patient stable, afebrile. Negative focal neurological deficits noted. Discharged patient. Discussed with patient to rest and stay hydrated. Discussed with patient to avoid any physical strenuous activity. Patient placed in the sleeve and crutches for comfort. Referred patient to primary care provider and  orthopedics. Discussed with patient to closely monitor symptoms and if symptoms are to worsen or change to report back to the ED - strict return instructions given.  Patient agreed to plan of care, understood, all questions answered.   Raymon MuttonMarissa Sundae Maners, PA-C 09/17/13 1926

## 2013-09-18 NOTE — ED Provider Notes (Signed)
Medical screening examination/treatment/procedure(s) were performed by non-physician practitioner and as supervising physician I was immediately available for consultation/collaboration.   EKG Interpretation None        Sander Remedios L Terin Dierolf, MD 09/18/13 1430 

## 2013-10-21 ENCOUNTER — Encounter (HOSPITAL_COMMUNITY): Payer: Self-pay | Admitting: Emergency Medicine

## 2013-10-21 ENCOUNTER — Emergency Department (HOSPITAL_COMMUNITY): Payer: Medicaid Other

## 2013-10-21 ENCOUNTER — Emergency Department (HOSPITAL_COMMUNITY)
Admission: EM | Admit: 2013-10-21 | Discharge: 2013-10-21 | Disposition: A | Discharge status: Home / Self Care | Payer: Medicaid Other | Attending: Emergency Medicine | Admitting: Emergency Medicine

## 2013-10-21 DIAGNOSIS — Z862 Personal history of diseases of the blood and blood-forming organs and certain disorders involving the immune mechanism: Secondary | ICD-10-CM | POA: Insufficient documentation

## 2013-10-21 DIAGNOSIS — Y929 Unspecified place or not applicable: Secondary | ICD-10-CM | POA: Insufficient documentation

## 2013-10-21 DIAGNOSIS — Z3202 Encounter for pregnancy test, result negative: Secondary | ICD-10-CM | POA: Insufficient documentation

## 2013-10-21 DIAGNOSIS — Z8639 Personal history of other endocrine, nutritional and metabolic disease: Secondary | ICD-10-CM | POA: Insufficient documentation

## 2013-10-21 DIAGNOSIS — Y939 Activity, unspecified: Secondary | ICD-10-CM | POA: Insufficient documentation

## 2013-10-21 DIAGNOSIS — Z8679 Personal history of other diseases of the circulatory system: Secondary | ICD-10-CM | POA: Insufficient documentation

## 2013-10-21 DIAGNOSIS — R296 Repeated falls: Secondary | ICD-10-CM | POA: Insufficient documentation

## 2013-10-21 DIAGNOSIS — S20219A Contusion of unspecified front wall of thorax, initial encounter: Secondary | ICD-10-CM | POA: Insufficient documentation

## 2013-10-21 DIAGNOSIS — Z880 Allergy status to penicillin: Secondary | ICD-10-CM | POA: Insufficient documentation

## 2013-10-21 DIAGNOSIS — S20212A Contusion of left front wall of thorax, initial encounter: Secondary | ICD-10-CM

## 2013-10-21 LAB — POC URINE PREG, ED: Preg Test, Ur: NEGATIVE

## 2013-10-21 MED ORDER — OXYCODONE-ACETAMINOPHEN 5-325 MG PO TABS
2.0000 | ORAL_TABLET | Freq: Once | ORAL | Status: AC
  Administered 2013-10-21: 2 via ORAL
  Filled 2013-10-21: qty 2

## 2013-10-21 MED ORDER — IBUPROFEN 600 MG PO TABS: 600.0000 mg | ORAL_TABLET | Freq: Four times a day (QID) | ORAL | Status: DC | PRN

## 2013-10-21 MED ORDER — IBUPROFEN 800 MG PO TABS
800.0000 mg | ORAL_TABLET | Freq: Once | ORAL | Status: AC
  Administered 2013-10-21: 800 mg via ORAL
  Filled 2013-10-21: qty 1

## 2013-10-21 MED ORDER — CYCLOBENZAPRINE HCL 10 MG PO TABS: 10.0000 mg | ORAL_TABLET | Freq: Two times a day (BID) | ORAL | Status: DC | PRN

## 2013-10-21 NOTE — ED Notes (Signed)
Placed ice pack on right side of back area. Noted bruising to right side of back.

## 2013-10-21 NOTE — ED Notes (Signed)
Patient states she fell earlier tonight; c/o left mid back pain; states she hit box spring on bed.

## 2013-10-21 NOTE — Discharge Instructions (Signed)
Chest Contusion °A chest contusion is a deep bruise on your chest area. Contusions are the result of an injury that caused bleeding under the skin. A chest contusion may involve bruising of the skin, muscles, or ribs. The contusion may turn blue, purple, or yellow. Minor injuries will give you a painless contusion, but more severe contusions may stay painful and swollen for a few weeks. °CAUSES  °A contusion is usually caused by a blow, trauma, or direct force to an area of the body. °SYMPTOMS  °· Swelling and redness of the injured area. °· Discoloration of the injured area. °· Tenderness and soreness of the injured area. °· Pain. °DIAGNOSIS  °The diagnosis can be made by taking a history and performing a physical exam. An X-ray, CT scan, or MRI may be needed to determine if there were any associated injuries, such as broken bones (fractures) or internal injuries. °TREATMENT  °Often, the best treatment for a chest contusion is resting, icing, and applying cold compresses to the injured area. Deep breathing exercises may be recommended to reduce the risk of pneumonia. Over-the-counter medicines may also be recommended for pain control. °HOME CARE INSTRUCTIONS  °· Put ice on the injured area. °· Put ice in a plastic bag. °· Place a towel between your skin and the bag. °· Leave the ice on for 15-20 minutes, 03-04 times a day. °· Only take over-the-counter or prescription medicines as directed by your caregiver. Your caregiver may recommend avoiding anti-inflammatory medicines (aspirin, ibuprofen, and naproxen) for 48 hours because these medicines may increase bruising. °· Rest the injured area. °· Perform deep-breathing exercises as directed by your caregiver. °· Stop smoking if you smoke. °· Do not lift objects over 5 pounds (2.3 kg) for 3 days or longer if recommended by your caregiver. °SEEK IMMEDIATE MEDICAL CARE IF:  °· You have increased bruising or swelling. °· You have pain that is getting worse. °· You have  difficulty breathing. °· You have dizziness, weakness, or fainting. °· You have blood in your urine or stool. °· You cough up or vomit blood. °· Your swelling or pain is not relieved with medicines. °MAKE SURE YOU:  °· Understand these instructions. °· Will watch your condition. °· Will get help right away if you are not doing well or get worse. °Document Released: 02/14/2001 Document Revised: 02/14/2012 Document Reviewed: 11/13/2011 °[employer]® Patient Information ©2014 [employer]. ° °

## 2013-10-21 NOTE — ED Provider Notes (Signed)
CSN: [id number]     Arrival date & time 10/21/13  0145 History   First MD Initiated Contact with Patient 10/21/13 0220     Chief Complaint  Patient presents with  . Fall     (Consider location/radiation/quality/duration/timing/severity/associated sxs/prior Treatment) HPI History provided by patient. Fall at home tonight., Reports mechanical fall injuring her left posterior ribs. Pain sharp in quality and moderate in severity, hurts to take a deep breath. No bruising. No skin breaks. No other injury and fall. No abdominal pain otherwise. No LOC. No neck injury. Denies striking her head. No chest pain or shortness of breath. Past Medical History  Diagnosis Date  . Migraine   . Polycystic ovarian syndrome    Past Surgical History  Procedure Laterality Date  . Cesarean section    . Finger amputation      right pinky finger  . Tonsillectomy     Family History  Problem Relation Age of Onset  . Hypertension Mother   . Diabetes Father   . Heart failure Father    History  Substance Use Topics  . Smoking status: Never Smoker   . Smokeless tobacco: Never Used  . Alcohol Use: No   OB History   Grav Para Term Preterm Abortions TAB SAB Ect Mult Living   4 1 1  3  3   1      Review of Systems  Constitutional: Negative for fever and chills.  Eyes: Negative for pain.  Respiratory: Negative for shortness of breath.   Cardiovascular: Negative for palpitations.  Gastrointestinal: Negative for vomiting and abdominal pain.  Genitourinary: Negative for flank pain.  Musculoskeletal: Negative for back pain and neck pain.  Skin: Negative for rash.  Neurological: Negative for headaches.  All other systems reviewed and are negative.     Allergies  Tramadol; Mobic; Penicillins; and Sulfa antibiotics  Home Medications   Prior to Admission medications   Medication Sig Start Date End Date Taking? Authorizing Provider  acetaminophen (TYLENOL) 325 MG tablet Take 1 tablet (325 mg total) by  mouth every 6 (six) hours as needed. 09/17/13   Marissa Sciacca, PA-C   BP 145/95  Pulse 89  Temp(Src) 98.1 F (36.7 C) (Oral)  Resp 20  Ht 4\' 11"  (1.499 m)  Wt 168 lb (76.204 kg)  BMI 33.91 kg/m2  SpO2 100% Physical Exam  Constitutional: She is oriented to person, place, and time. She appears well-developed and well-nourished.  HENT:  Head: Normocephalic and atraumatic.  Eyes: EOM are normal. Pupils are equal, round, and reactive to light.  Neck: Neck supple.  No cervical spine tenderness or deformity  Cardiovascular: Normal rate, regular rhythm and intact distal pulses.   Pulmonary/Chest: Effort normal and breath sounds normal. No respiratory distress.  Tenderness over left posterior lateral lower ribs. No crepitus. No associated abdominal tenderness.  Abdominal: She exhibits no distension.  Musculoskeletal: Normal range of motion. She exhibits no edema.  Neurological: She is alert and oriented to person, place, and time.  Skin: Skin is warm and dry.    ED Course  Procedures (including critical care time) Labs Review Labs Reviewed  POC URINE PREG, ED    Imaging Review Dg Chest 2 View  10/21/2013   CLINICAL DATA:  History of trauma after falling out of bed.  EXAM: CHEST  2 VIEW  COMPARISON:  No priors.  FINDINGS: Lung volumes are normal. No consolidative airspace disease. No pleural effusions. No pneumothorax. No pulmonary nodule or mass noted. Pulmonary vasculature and the  cardiomediastinal silhouette are within normal limits.  IMPRESSION: No radiographic evidence of acute cardiopulmonary disease.   Electronically Signed   By: Trudie Reedaniel  Entrikin M.D.   On: 10/21/2013 03:14    Ice. Motrin. Percocet.  Plan discharge home with prescription for Motrin Flexeril as needed, call primary care physician for close outpatient followup.  MDM   Diagnosis: Contusion left ribs  Evaluated with chest x-rays above. Medications provided. Vital signs and nurses notes reviewed and  considered. No abdominal tenderness   Sunnie NielsenBrian Curren Mohrmann, MD 10/21/13 [phone]

## 2014-04-06 ENCOUNTER — Encounter (HOSPITAL_COMMUNITY): Payer: Self-pay | Admitting: Emergency Medicine

## 2015-10-05 IMAGING — CR DG KNEE 1-2V*L*
2 series · 2 of 2 positions shown · non-contrast
Comparison: None.

CLINICAL DATA: Left knee pain for several months worsening over the
preceding 2 days without known injury

EXAM:
LEFT KNEE - 1-2 VIEW

[t knee ap left]
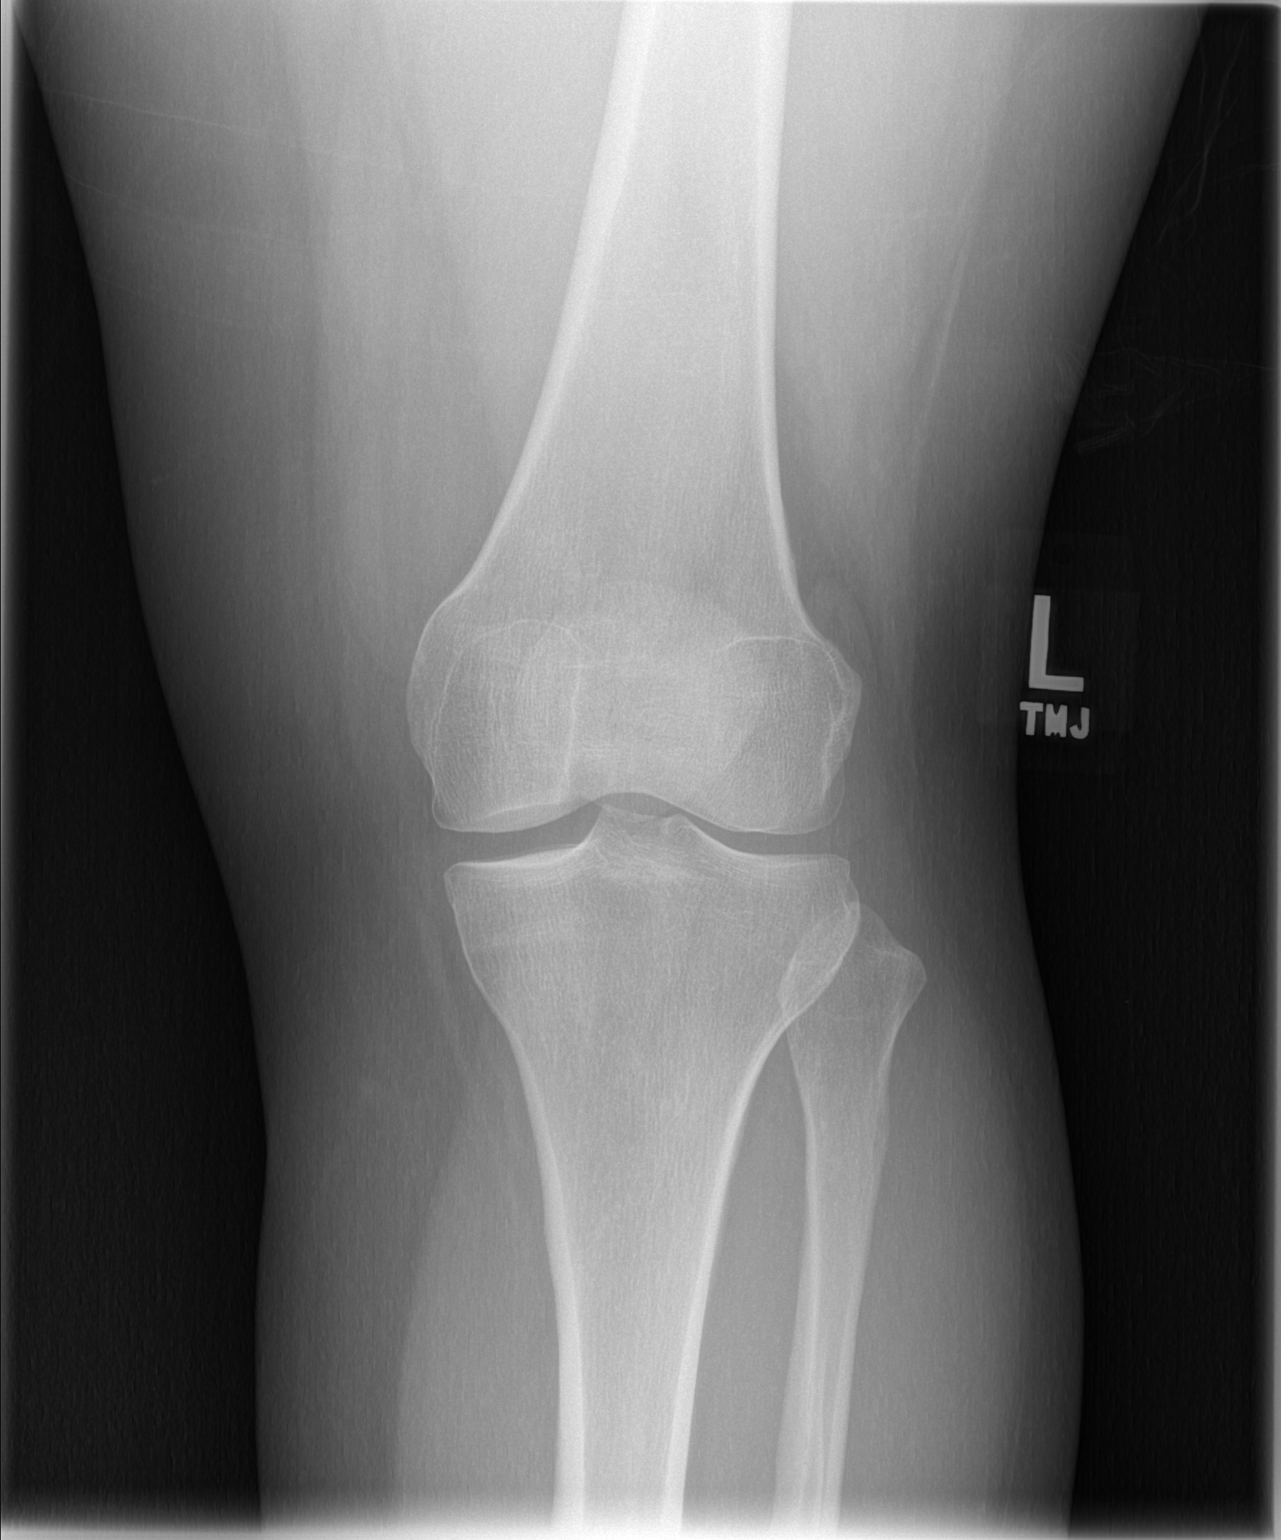

[t knee lat left]
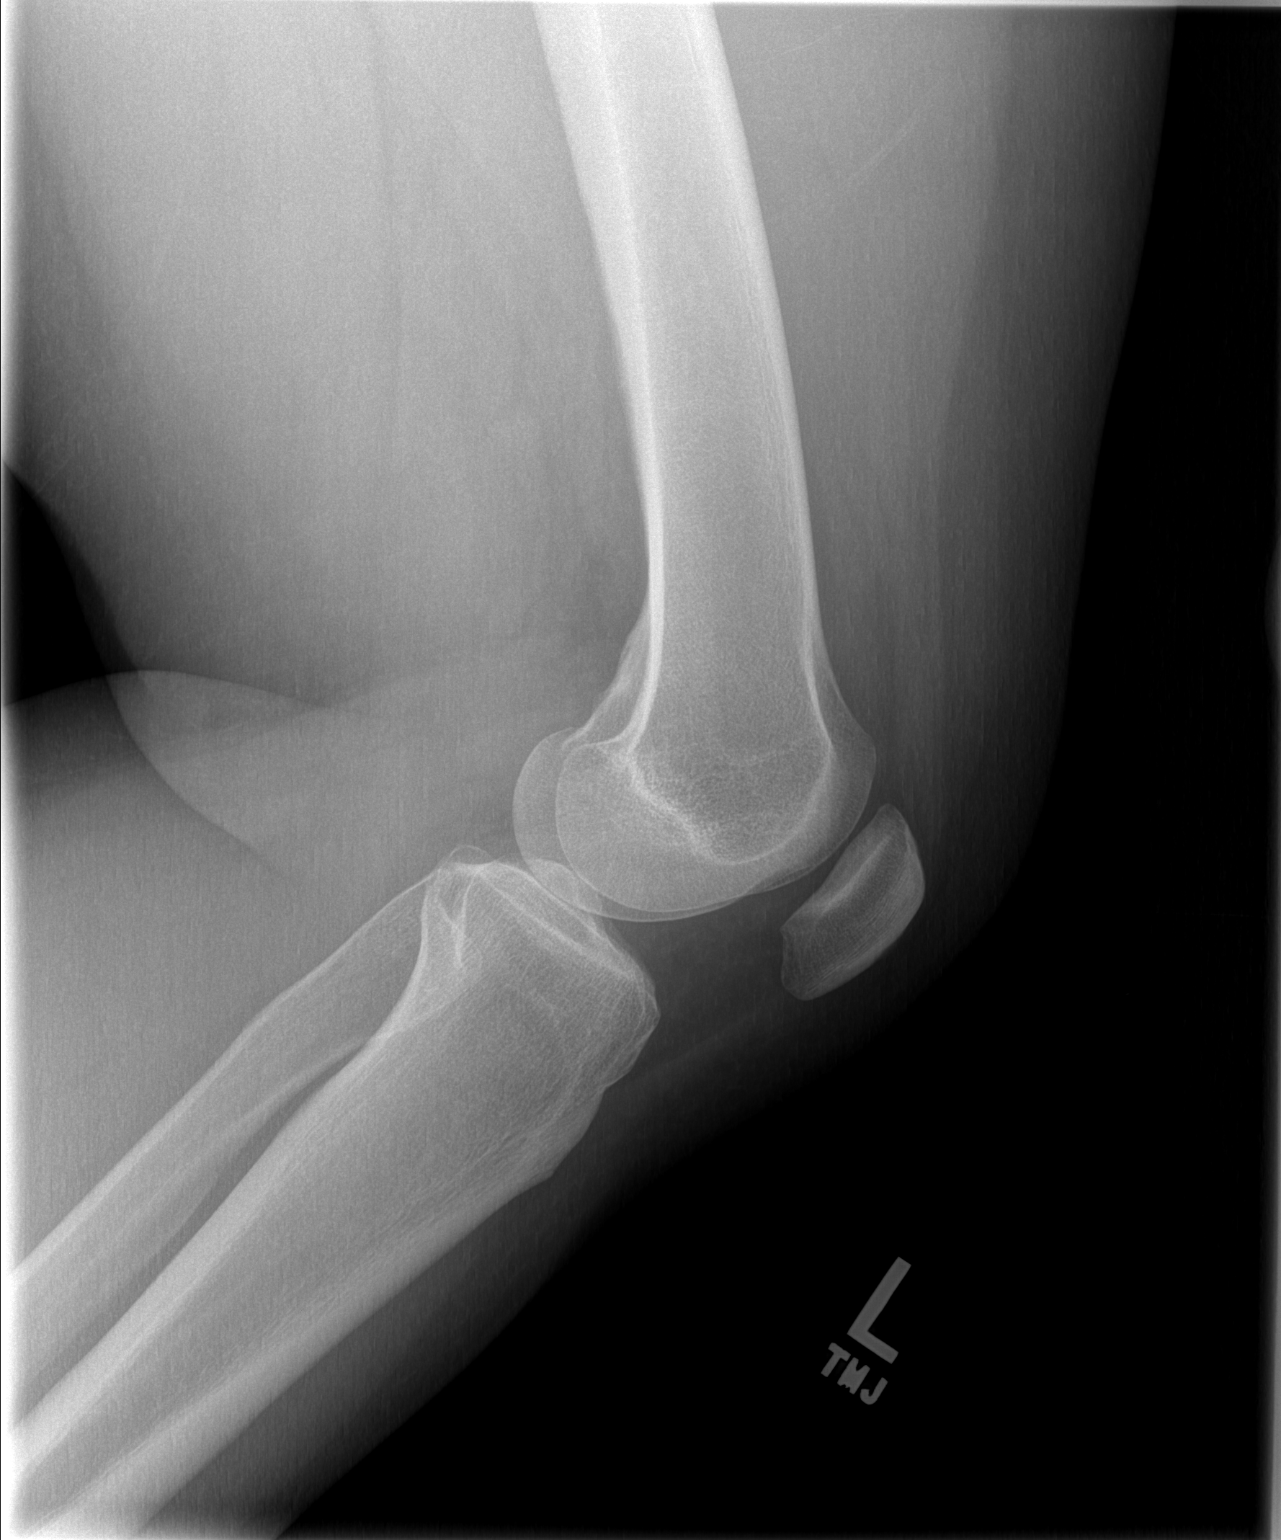

[2 of 2 positions shown; findings below may reference images not displayed]

FINDINGS: AP and lateral views of the left knee reveal the bones to be
adequately mineralized. There is mild beaking of the medial tibial
spine. The joint spaces are reasonably well maintained on this
nonweightbearing series. No joint effusion is evident. There are no
abnormal soft tissue calcifications.
IMPRESSION: There is no acute bony abnormality of the left knee. Minimal
degenerative beaking of the medial tibial spine is present.

## 2015-11-08 IMAGING — CR DG CHEST 2V
2 series · 2 of 2 positions shown · non-contrast
Comparison: No priors.

CLINICAL DATA: History of trauma after falling out of bed.

EXAM:
CHEST  2 VIEW

[view not recorded (1 of 2)]
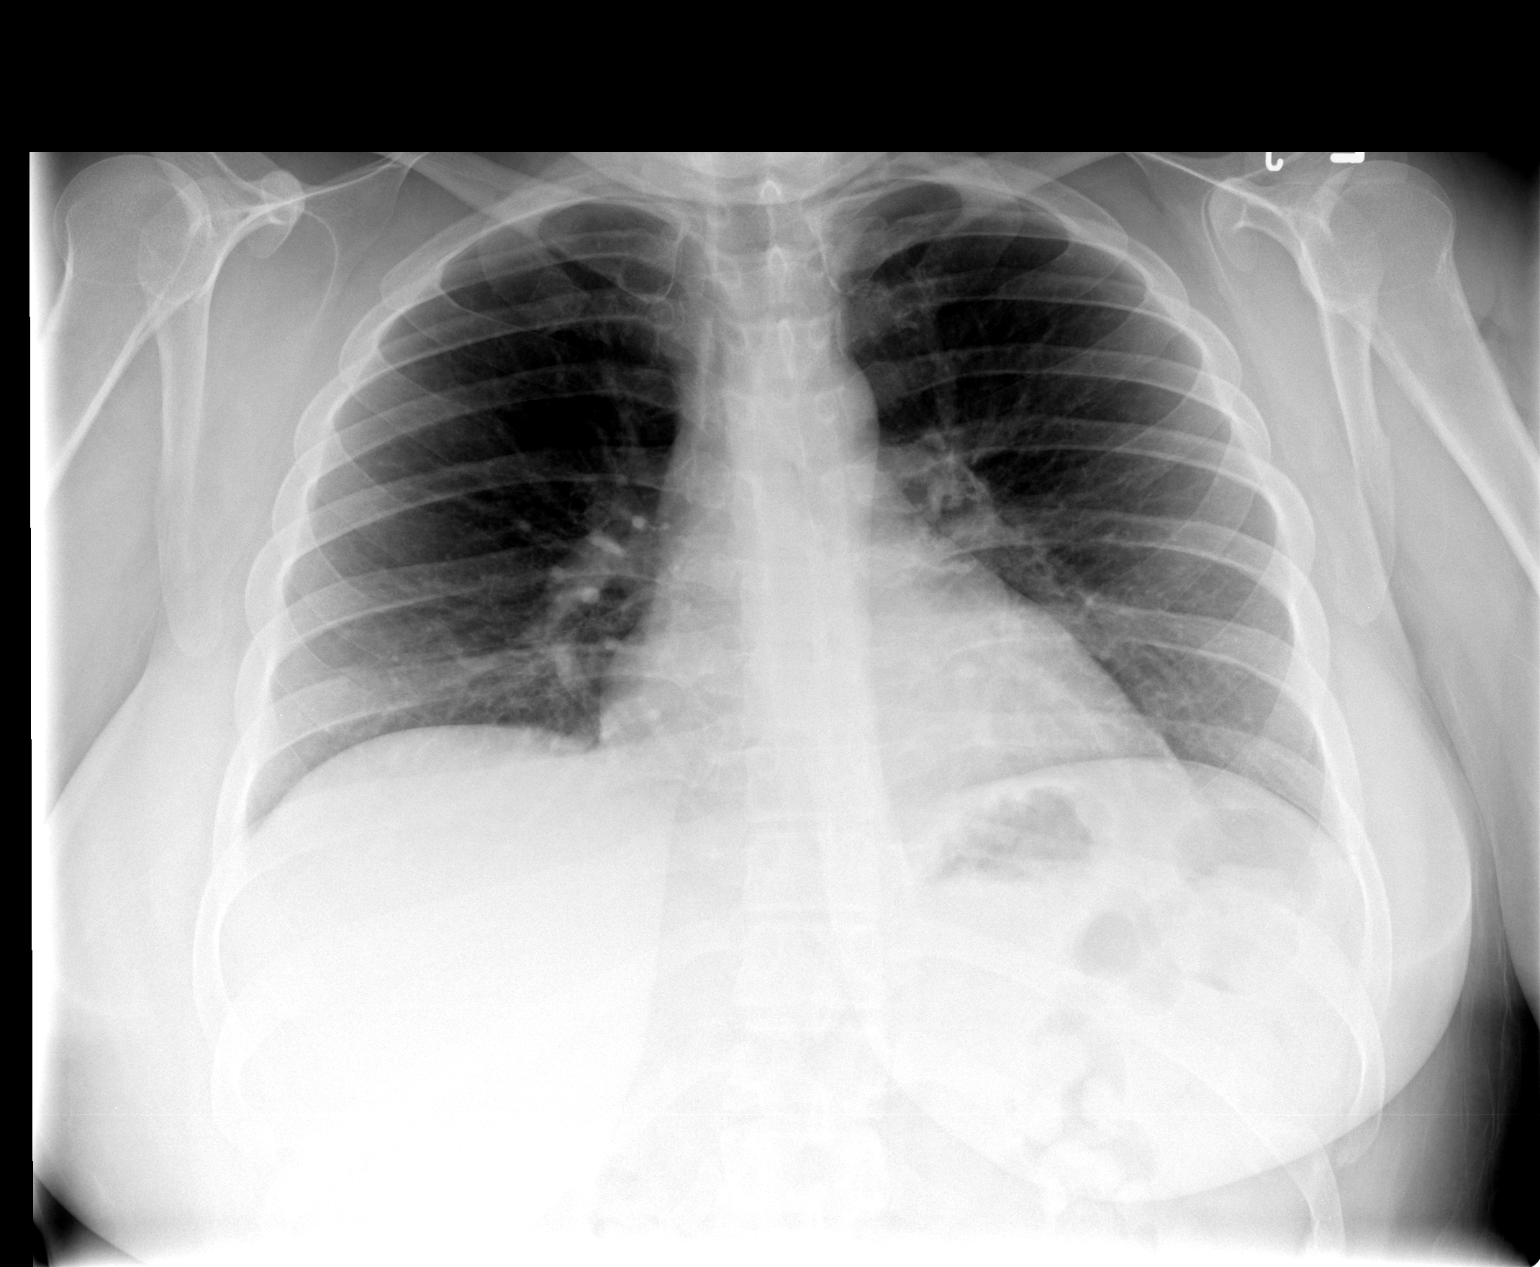

[view not recorded (2 of 2)]
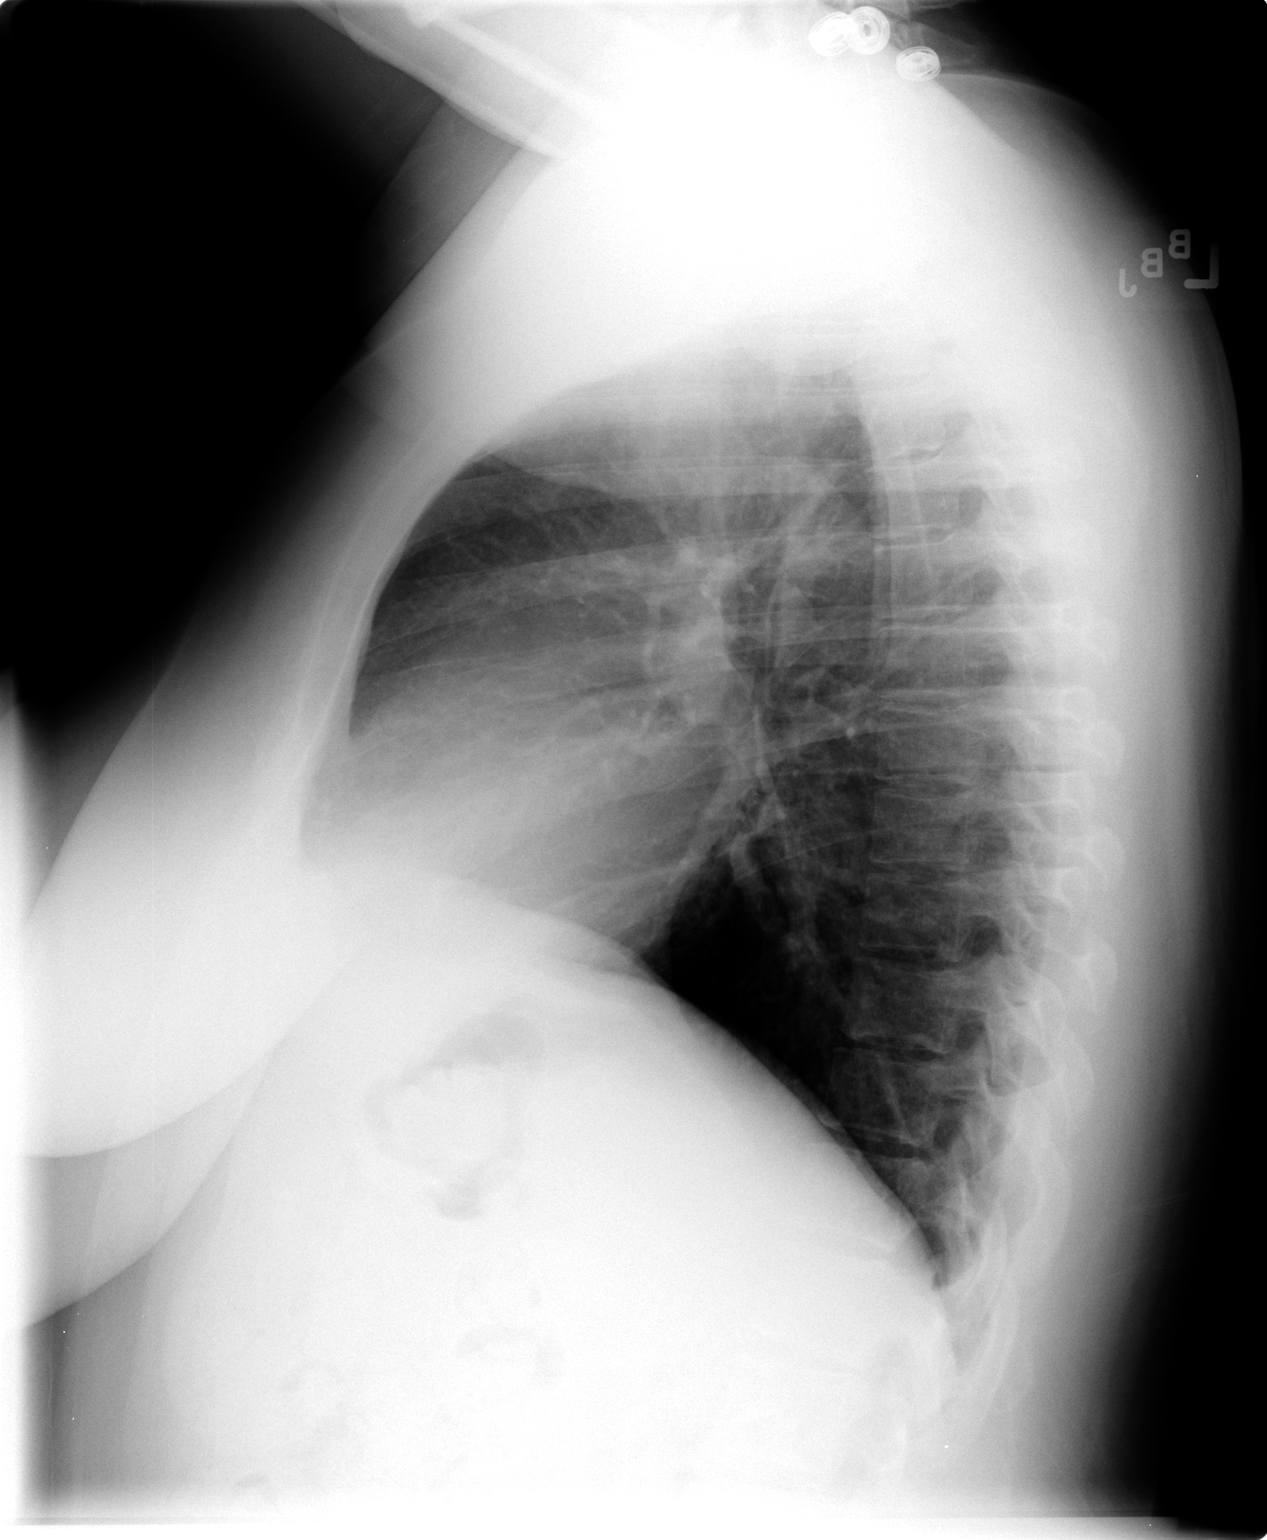

[2 of 2 positions shown; findings below may reference images not displayed]

FINDINGS: Lung volumes are normal. No consolidative airspace disease. No
pleural effusions. No pneumothorax. No pulmonary nodule or mass
noted. Pulmonary vasculature and the cardiomediastinal silhouette
are within normal limits.
IMPRESSION: No radiographic evidence of acute cardiopulmonary disease.

## 2022-11-07 ENCOUNTER — Ambulatory Visit: Payer: Medicaid Other | Admitting: Internal Medicine

## 2022-12-12 ENCOUNTER — Ambulatory Visit: Payer: Medicaid Other | Admitting: Internal Medicine

## 2022-12-25 ENCOUNTER — Other Ambulatory Visit: Payer: Self-pay | Admitting: Internal Medicine

## 2022-12-25 ENCOUNTER — Encounter: Payer: Self-pay | Admitting: Internal Medicine

## 2022-12-25 ENCOUNTER — Ambulatory Visit: Payer: Medicaid Other | Admitting: Internal Medicine

## 2022-12-25 VITALS — BP 130/88 | HR 93 | Ht 59.0 in | Wt 215.1 lb

## 2022-12-25 DIAGNOSIS — Z1159 Encounter for screening for other viral diseases: Secondary | ICD-10-CM | POA: Diagnosis not present

## 2022-12-25 DIAGNOSIS — K047 Periapical abscess without sinus: Secondary | ICD-10-CM

## 2022-12-25 DIAGNOSIS — Z114 Encounter for screening for human immunodeficiency virus [HIV]: Secondary | ICD-10-CM

## 2022-12-25 DIAGNOSIS — Z13220 Encounter for screening for lipoid disorders: Secondary | ICD-10-CM | POA: Diagnosis not present

## 2022-12-25 DIAGNOSIS — F1121 Opioid dependence, in remission: Secondary | ICD-10-CM

## 2022-12-25 DIAGNOSIS — Z131 Encounter for screening for diabetes mellitus: Secondary | ICD-10-CM | POA: Diagnosis not present

## 2022-12-25 DIAGNOSIS — G43119 Migraine with aura, intractable, without status migrainosus: Secondary | ICD-10-CM

## 2022-12-25 DIAGNOSIS — E282 Polycystic ovarian syndrome: Secondary | ICD-10-CM | POA: Insufficient documentation

## 2022-12-25 DIAGNOSIS — R030 Elevated blood-pressure reading, without diagnosis of hypertension: Secondary | ICD-10-CM

## 2022-12-25 MED ORDER — BUPRENORPHINE HCL 8 MG SL SUBL: 8.0000 mg | SUBLINGUAL_TABLET | Freq: Three times a day (TID) | SUBLINGUAL | 0 refills | Status: DC

## 2022-12-25 MED ORDER — AZITHROMYCIN 250 MG PO TABS
ORAL_TABLET | ORAL | 0 refills | Status: AC
Start: 2022-12-25 — End: 2022-12-30

## 2022-12-25 MED ORDER — SUMATRIPTAN SUCCINATE 100 MG PO TABS: 100.0000 mg | ORAL_TABLET | ORAL | 0 refills | Status: DC | PRN

## 2022-12-25 NOTE — Assessment & Plan Note (Signed)
One time Hep C screening in low risk individual.

## 2022-12-25 NOTE — Assessment & Plan Note (Signed)
Hx of elevated blood pressure reported.  BP today is 130/88 Counseled on weight loss Continue to monitor

## 2022-12-25 NOTE — Assessment & Plan Note (Addendum)
BMI 43 Chronic problem, uncontrolled Counseled on diet and exercise. Recommend drinking water only. Look for ways to reduce time sitting and increase time moving.  I recommend at least 150 minutes of moderate-intensity physical activity a week for adults. You might split that into 30 minutes, 5 days a week.

## 2022-12-25 NOTE — Assessment & Plan Note (Signed)
One time screening in low risk individual

## 2022-12-25 NOTE — Progress Notes (Signed)
HPI:[name] is a 42 y.o. female with PMHx of opioid use disorder, PCOS, and migraines who presents to establish care. Patient is due for pap smear and re-establishing with ob/gyn in [place] to have this completed. She would like to put off Tetanus vaccine. For the details of today's visit, please refer to the assessment and plan.   Past Medical History:  Diagnosis Date   Migraine    Opioid use disorder    Polycystic ovarian syndrome     Past Surgical History:  Procedure Laterality Date   CESAREAN SECTION     FINGER AMPUTATION     right pinky finger   TONSILLECTOMY      Family History  Problem Relation Age of Onset   Stroke Mother    Hypertension Mother    Aneurysm Mother 51       Brain   Aortic aneurysm Mother        Cant be repaired due to other medical conditions   Diabetes Father    Heart failure Father     Social History   Tobacco Use   Smoking status: Never   Smokeless tobacco: Never  Substance Use Topics   Alcohol use: Yes    Comment: occasionally   Drug use: Yes    Types: Oxycodone     Physical Exam: Vitals:   12/25/22 0858  BP: 130/88  Pulse: 93  SpO2: 97%  Weight: 215 lb 1.9 oz (97.6 kg)  Height: 4\' 11"  (1.499 m)     Physical Exam Constitutional:      Appearance: Normal appearance. She is morbidly obese.  Cardiovascular:     Rate and Rhythm: Normal rate and regular rhythm.     Heart sounds: No murmur heard. Pulmonary:     Effort: Pulmonary effort is normal.     Breath sounds: No wheezing or rales.  Musculoskeletal:        General: No swelling or tenderness.  Skin:    Findings: No bruising or lesion.  Psychiatric:        Mood and Affect: Affect normal.        Behavior: Behavior normal.      Assessment & Plan:   Blood pressure elevated without history of HTN Hx of elevated blood pressure reported.  BP today is 130/88 Counseled on weight loss Continue to monitor   Migraine First migraine was in childhood Patient  has headaches which are described as severe, pulsating quality, usually effects left side, and cause nausea. Last up to 2 days. Improved with resting in quiet and dark room.    Chronic problem , uncontrolled Take tylenol for mild attacks  Imitrex prescribed to take at onset of migraine for abortive therapy. - SUMAtriptan (IMITREX) 100 MG tablet; Take 1 tablet (100 mg total) by mouth every 2 (two) hours as needed for migraine (Max dose 200 mg in 24 hours). May repeat in 2 hours if headache persists or recurs.  Dispense: 10 tablet; Refill: 0   Morbid obesity (HCC) BMI 43 Chronic problem, uncontrolled Counseled on diet and exercise. Recommend drinking water only. Look for ways to reduce time sitting and increase time moving.  I recommend at least 150 minutes of moderate-intensity physical activity a week for adults. You might split that into 30 minutes, 5 days a week.    Need for hepatitis C screening test One time Hep C screening in low risk individual.   Opioid use disorder, severe, in sustained remission [name]) This is a 10 old with  a history of OUD who presented to establish care. Her OUD started after treatment with opioid pain medication for knee injury around 19.  She was under treatment with Dr.Bowen but missed drug screening test. Subutex 8 mg TID was controlling cravings well. Reviewed PDMP , [name] filled medication for 14 days on 7/8 . Patient reports this person works with Dr.Bowen.   Chronic problem, controlled  This patient has Opioid Use Disorder by following DSM-V criteria - Opioids taken in larger amounts or over a longer period than intended - Persistent desire to cut down - A great deal of time is spent to obtain/use/recover from the opioid - Cravings to use opioids - Use resulting in a failure to fulfill major role obligations - Continue opioid use despite persistent social or interpersonal problems - Important activities are given up or reduced because of opioid  use - Recurrent opioid use in situations in which it is physically hazardous - Use despite knowledge of health problems caused by opioids - Tolerance - Withdrawal  Filled buprenorphine for 30 days Recommend follow up in 2 weeks to establish contract and/or discuss referral for ongoing treatment with new PCP, Dixon MD.      Screening for HIV (human immunodeficiency virus) One time screening in low risk individual  Screening for diabetes mellitus Patient has morbid obesity and family history of stroke Will screen for diabetes with hemoglobin A1c  Screening for hyperlipidemia Patient has morbid obesity and family history of stroke Screen for HLD     Milus Banister, MD

## 2022-12-25 NOTE — Patient Instructions (Addendum)
Thank you, Ms.Donald Jacque for allowing [place] to provide your care today.   I have ordered the following labs for you:   Lab Orders         HIV Antibody (routine testing w rflx)         Hepatitis C Antibody         Lipid panel         Hemoglobin A1c        Reminders: Follow up in 2 weeks with Dr.Dixon.    Thurmon Fair, M.D.

## 2022-12-25 NOTE — Progress Notes (Signed)
Patient has allergy to penicillin. Sending Azithromycin for tooth infection.

## 2022-12-25 NOTE — Assessment & Plan Note (Signed)
Patient has morbid obesity and family history of stroke Screen for HLD

## 2022-12-25 NOTE — Assessment & Plan Note (Signed)
First migraine was in childhood Patient has headaches which are described as severe, pulsating quality, usually effects left side, and cause nausea. Last up to 2 days. Improved with resting in quiet and dark room.    Chronic problem , uncontrolled Take tylenol for mild attacks  Imitrex prescribed to take at onset of migraine for abortive therapy. - SUMAtriptan (IMITREX) 100 MG tablet; Take 1 tablet (100 mg total) by mouth every 2 (two) hours as needed for migraine (Max dose 200 mg in 24 hours). May repeat in 2 hours if headache persists or recurs.  Dispense: 10 tablet; Refill: 0

## 2022-12-25 NOTE — Assessment & Plan Note (Signed)
This is a 43 old with a history of OUD who presented to establish care. Her OUD started after treatment with opioid pain medication for knee injury around 19.  She was under treatment with Dr.Bowen but missed drug screening test. Subutex 8 mg TID was controlling cravings well. Reviewed PDMP , [name] filled medication for 14 days on 7/8 . Patient reports this person works with Dr.Bowen.   Chronic problem, controlled  This patient has Opioid Use Disorder by following DSM-V criteria - Opioids taken in larger amounts or over a longer period than intended - Persistent desire to cut down - A great deal of time is spent to obtain/use/recover from the opioid - Cravings to use opioids - Use resulting in a failure to fulfill major role obligations - Continue opioid use despite persistent social or interpersonal problems - Important activities are given up or reduced because of opioid use - Recurrent opioid use in situations in which it is physically hazardous - Use despite knowledge of health problems caused by opioids - Tolerance - Withdrawal  Filled buprenorphine for 30 days Recommend follow up in 2 weeks to establish contract and/or discuss referral for ongoing treatment with new PCP, Dixon MD.

## 2022-12-25 NOTE — Assessment & Plan Note (Signed)
Patient has morbid obesity and family history of stroke Will screen for diabetes with hemoglobin A1c

## 2022-12-26 ENCOUNTER — Encounter: Payer: Self-pay | Admitting: Internal Medicine

## 2022-12-26 ENCOUNTER — Other Ambulatory Visit: Payer: Self-pay | Admitting: Internal Medicine

## 2022-12-26 ENCOUNTER — Telehealth: Payer: Medicaid Other | Admitting: Nurse Practitioner

## 2022-12-26 DIAGNOSIS — F1121 Opioid dependence, in remission: Secondary | ICD-10-CM

## 2022-12-26 LAB — HEPATITIS C ANTIBODY: Hep C Virus Ab: NONREACTIVE

## 2022-12-26 LAB — LIPID PANEL
Chol/HDL Ratio: 7.3 ratio — ABNORMAL HIGH (ref 0.0–4.4)
Cholesterol, Total: 183 mg/dL (ref 100–199)
HDL: 25 mg/dL — ABNORMAL LOW (ref >39)
LDL Chol Calc (NIH): 95 mg/dL (ref 0–99)
Triglycerides: 379 mg/dL — ABNORMAL HIGH (ref 0–149)
VLDL Cholesterol Cal: 63 mg/dL — ABNORMAL HIGH (ref 5–40)

## 2022-12-26 LAB — TSH: TSH: 3.16 u[IU]/mL (ref 0.450–4.500)

## 2022-12-26 LAB — HEMOGLOBIN A1C
Est. average glucose Bld gHb Est-mCnc: 94 mg/dL
Hgb A1c MFr Bld: 4.9 % (ref 4.8–5.6)

## 2022-12-26 LAB — HIV ANTIBODY (ROUTINE TESTING W REFLEX): HIV Screen 4th Generation wRfx: NONREACTIVE

## 2022-12-26 MED ORDER — BUPRENORPHINE HCL-NALOXONE HCL 8-2 MG SL SUBL
1.0000 | SUBLINGUAL_TABLET | Freq: Three times a day (TID) | SUBLINGUAL | 0 refills | Status: DC
Start: 2022-12-26 — End: 2022-12-26

## 2022-12-26 NOTE — Telephone Encounter (Signed)
Called patient to discuss request to only prescribe subutex. Appears she was being prescribed suboxone by other providers. I was under impression other providers worked with Dr.Bowen at [facility] when talking to patient.. Patient reports she was dismissed by Dr.Bowen's office because she missed drug screening when she had to go to a funeral. Patient welcomed me to contact Dr.Bowen's office. Dr.Bowen's office let me know patient was dismissed because they found patient was being seen in [place] by looking at [facility]. I called [facility].  They said patient was seeing them until July 17th. She was dismissed from their group because she missed counseling session.  Patient denies every being seen at this practice called [facility].I. I spoke to PCP , Dr.Dixon, who does not manage opioid use disorder. I recommend patient follow up with Dr.Bowen for suggestions of other practices. I will no longer be in [place] after this week and therefore can not take on management.

## 2022-12-26 NOTE — Progress Notes (Signed)
No show for video visit

## 2022-12-26 NOTE — Telephone Encounter (Signed)
Please clarify with patient she is aware of cost because insurance is not covering Subutex. Update me and I will send the Subutex. We will need to update pharmacy to cancel Suboxone. Naloxone is not on her allergy list. In addition please ask patient if she signed for her records to be sent from Dr.Bowen. Dr.Dixon will need these records by her next visit.

## 2022-12-26 NOTE — Progress Notes (Signed)
Subutex not covered. Sending suboxone tablets.

## 2022-12-26 NOTE — Addendum Note (Signed)
Addended by: Gardenia Phlegm on: 12/26/2022 04:34 PM   Modules accepted: Orders

## 2023-01-19 ENCOUNTER — Ambulatory Visit: Payer: Medicaid Other | Admitting: Internal Medicine

## 2023-01-23 ENCOUNTER — Encounter: Payer: Self-pay | Admitting: Internal Medicine

## 2023-06-13 NOTE — ED Provider Notes (Signed)
 Emergency Department Provider Note    ED Clinical Impression   Final diagnoses:  Acute nonintractable headache, unspecified headache type (Primary)    ED Assessment/Plan    Condition: Stable Disposition: Discharge  This chart has been completed using Dragon Medical Dictation software, and while attempts have been made to ensure accuracy, certain words and phrases may not be transcribed as intended.   History   Chief Complaint  Patient presents with  . Headache New Onset or New Symptoms   HPI  Jacayla Nordell is a 43 y.o. female who presents today to the emergency department via EMS complaining of a sudden onset of a headache with nausea, vomiting, seeing black spots in her right eye and numbness on the left side of her face.  She reports that the visual disturbance and the numbness in her face have resolved.  The headache came on suddenly at approximately 1:00 this afternoon.  The pain is constant, a 10 at its worst, a 9 at its best and aggravated with sound and light, but no alleviating factors have been identified.  Allergies: is allergic to tramadol, meloxicam, penicillins, and sulfa (sulfonamide antibiotics). Medications: has a current medication list which includes the following long-term medication(s): famotidine. PMHx:  has a past medical history of History of drug use, Migraines, Panic attacks, PCOS (polycystic ovarian syndrome), Pneumonia (2015), Polycystic ovary syndrome, and Postpartum depression. PSHx:  has a past surgical history that includes Cesarean section (2006); Tonsillectomy; Finger surgery (Right); pr cesarean delivery only (N/A, 03/19/2020); and Cesarean section. SocHx:  reports that she has never smoked. She has never used smokeless tobacco. She reports that she does not currently use alcohol. She reports that she does not currently use drugs. Allergies, Medications,  Medical, Surgical, and Social History were reviewed as documented above.   Social Drivers of Health with Concerns   Food Insecurity: Not on file  Internet Connectivity: Not on file  Housing/Utilities: Not on file  Transportation Needs: Not on file  Interpersonal Safety: Not on file  Physical Activity: Not on file  Stress: Not on file  Substance Use: Not on file (04/11/2023)  Social Connections: Not on file  Financial Resource Strain: Not on file     Review Of Systems  Review of Systems  Constitutional:  Negative for chills and fever.  Eyes:  Positive for visual disturbance.  Respiratory:  Negative for shortness of breath.   Cardiovascular:  Negative for chest pain.  Gastrointestinal:  Positive for nausea and vomiting.  Neurological:  Positive for numbness and headaches.  All other systems reviewed and are negative.   Physical Exam   BP 134/96   Pulse 78   Temp 36.6 C (97.8 F) (Oral)   Resp 16   Ht 149.9 cm (4' 11)   Wt 96.6 kg (212 lb 14.4 oz)   SpO2 95%   BMI 43.00 kg/m   Physical Exam Vitals and nursing note reviewed.  Constitutional:      General: She is not in acute distress.    Appearance: She is well-developed.  HENT:     Head: Normocephalic and atraumatic.  Cardiovascular:     Rate and Rhythm: Normal rate and regular rhythm.     Heart sounds: Normal heart sounds.  Pulmonary:     Effort: Pulmonary effort is normal.  Breath sounds: Normal breath sounds.  Neurological:     General: No focal deficit present.     Mental Status: She is alert and oriented to person, place, and time.     GCS: GCS eye subscore is 4. GCS verbal subscore is 5. GCS motor subscore is 6.     Cranial Nerves: Cranial nerves 2-12 are intact.     Sensory: Sensation is intact.     Motor: Motor function is intact.     Coordination: Coordination is intact.     Gait: Gait is intact.  Psychiatric:        Mood and Affect: Mood normal.        Behavior: Behavior normal.     ED  Course  Medical Decision Making WBC is 9.8, hemoglobin is 15.2, UPT is negative, UDS is positive for opiates and oxycodone  and UA does not indicate a UTI.  Head CT does not show any acute intracranial abnormality.  After treatment, her headache has improved and her pain is now at a 3; she is stable for discharge home at this time.  Advised Tylenol  and increased hydration.  Follow-up with PCP.  I have reviewed my clinical findings and my clinical impression with the patient. The patient has expressed understanding that at this time there is no evidence for a more serious underlying process, but also understands that early in the process of a condition such as this, an initial workup can be falsely reassuring. I have counseled and discussed follow-up with the patient, stressing the importance of appropriate follow-up. I have also counseled the patient to return if symptoms worsen or if there are any concerns. Routine discharge counseling was given to the patient and the patient understands that worsening, changing or persistent symptoms should prompt an immediate call or follow up with their primary physician or return to the emergency department for reevaluation. Patient has expressed understanding.  Amount and/or Complexity of Data Reviewed Labs: ordered. Decision-making details documented in ED Course. Radiology: ordered. Decision-making details documented in ED Course.  Risk OTC drugs. Prescription drug management. Parenteral controlled substances. Decision regarding hospitalization.     Procedures   No results found for this visit on 06/13/23 (from the past 4464 hours).   ED Results Results for orders placed or performed during the hospital encounter of 06/13/23  Comprehensive Metabolic Panel  Result Value Ref Range   Sodium 143 135 - 145 mmol/L   Potassium 4.2 3.5 - 5.0 mmol/L   Chloride 104 98 - 107 mmol/L   CO2 31.2 21.0 - 32.0 mmol/L   Anion Gap 8 3 - 11 mmol/L   BUN 11 8 - 20  mg/dL   Creatinine 9.20 9.39 - 1.10 mg/dL   BUN/Creatinine Ratio 14    eGFR CKD-EPI (2021) Female >90 >=60 mL/min/1.35m2   Glucose 88 70 - 179 mg/dL   Calcium 8.4 (L) 8.5 - 10.1 mg/dL   Albumin 3.9 3.5 - 5.0 g/dL   Total Protein 7.8 6.0 - 8.0 g/dL   Total Bilirubin 0.6 0.3 - 1.2 mg/dL   AST 28 15 - 40 U/L   ALT 22 12 - 78 U/L   Alkaline Phosphatase 84 46 - 116 U/L  Magnesium  Result Value Ref Range   Magnesium 2.0 1.6 - 2.6 mg/dL  Urine, Pregnancy Qualitatve  Result Value Ref Range   Pregnancy Test, Urine Negative Negative  Drug Screen, Urine  Result Value Ref Range   Amphetamines Screen, Ur Negative Negative   Barbiturates Screen, Ur  Negative Negative   Benzodiazepines Screen, Urine Negative Negative   Cannabinoids Screen, Ur Negative Negative   Cocaine(Metab.)Screen, Urine Negative Negative   Methadone Screen, Urine Negative Negative   Opiates Screen, Ur Positive (A) Negative   Oxycodone  Screen, Ur Positive (A) Negative  CBC w/ Differential  Result Value Ref Range   WBC 9.8 4.0 - 10.5 10*9/L   RBC 5.01 3.80 - 5.10 10*12/L   HGB 15.2 (H) 11.5 - 15.0 g/dL   HCT 57.0 65.9 - 55.9 %   MCV 85.6 80.0 - 98.0 fL   MCH 30.3 27.0 - 34.0 pg   MCHC 35.4 32.0 - 36.0 g/dL   RDW 88.0 88.4 - 85.4 %   MPV 11.0 (H) 7.4 - 10.4 fL   Platelet 267 140 - 415 10*9/L   Neutrophils % 71.8 %   Lymphocytes % 22.9 %   Monocytes % 3.9 %   Eosinophils % 0.4 %   Basophils % 0.5 %   Absolute Neutrophils 7.0 1.8 - 7.8 10*9/L   Absolute Lymphocytes 2.2 0.7 - 4.5 10*9/L   Absolute Monocytes 0.4 0.1 - 1.0 10*9/L   Absolute Eosinophils 0.0 0.0 - 0.4 10*9/L   Absolute Basophils 0.1 0.0 - 0.2 10*9/L  Urinalysis with Microscopy with Culture Reflex  Result Value Ref Range   Color, UA Light Yellow    Clarity, UA Clear Clear   Specific Gravity, UA 1.015 1.010 - 1.025   pH, UA 7.0 5.0 - 8.0   Leukocyte Esterase, UA Negative Negative   Nitrite, UA Negative Negative   Protein, UA Trace (A) Negative    Glucose, UA Negative Negative   Ketones, UA Negative Negative   Urobilinogen, UA 0.2 mg/dL 0.1 - 1.0 mg/dL   Bilirubin, UA Negative Negative   Blood, UA Negative Negative   RBC, UA 0 0 - 5 /HPF   WBC, UA 0 <=5 /HPF   Squam Epithel, UA 2 0 - 10 /HPF   Bacteria, UA Few Few, Occasional, Small, None Seen /HPF   Mucus, UA Few Few, Occasional, Small /HPF   CT Head Wo Contrast Result Date: 06/13/2023 CLINICAL DATA:  Sudden onset of headache and visual disturbances. Left-sided numbness. Nausea and vomiting. Personal history of migraine headaches. EXAM: CT HEAD WITHOUT CONTRAST TECHNIQUE: Contiguous axial images were obtained from the base of the skull through the vertex without intravenous contrast. RADIATION DOSE REDUCTION: This exam was performed according to the departmental dose-optimization program which includes automated exposure control, adjustment of the mA and/or kV according to patient size and/or use of iterative reconstruction technique. COMPARISON:  Report of CT head without contrast 01/27/2009. Images are not available. FINDINGS: Brain: No acute infarct, hemorrhage, or mass lesion is present. The ventricles are of normal size. No significant extraaxial fluid collection is present. No significant white matter lesions are present. Deep brain nuclei are within normal limits. The brainstem and cerebellum are within normal limits. Midline structures are within normal limits. Vascular: No hyperdense vessel or unexpected calcification. Skull: Calvarium is intact. No focal lytic or blastic lesions are present. No significant extracranial soft tissue lesion is present. Sinuses/Orbits: The paranasal sinuses and mastoid air cells are clear. The globes and orbits are within normal limits.   Normal CT appearance of the brain. Electronically Signed   By: Lonni Necessary M.D.   On: 06/13/2023 17:50    Medications Administered:  Medications  sodium chloride  0.9% (NS) bolus 1,000 mL (0 mL Intravenous  Stopped 06/13/23 1900)  prochlorperazine (COMPAZINE) injection 10 mg (  10 mg Intravenous Given 06/13/23 1811)  diphenhydrAMINE  (BENADRYL ) injection (25 mg Intravenous Given 06/13/23 1811)    Discharge Medications (Medications Prescribed during this  ED visit and Patient's Home Medications) :    Your Medication List     ASK your doctor about these medications    famotidine 20 MG tablet Commonly known as: PEPCID Take 1 tablet (20 mg total) by mouth two (2) times a day for 15 days.   prenatal vits96-iron fum-folic 27 mg iron- 800 mcg Tab Take 1 tablet by mouth daily.   SUBOXONE  8-2 mg sublingual film Generic drug: buprenorphine -naloxone           Rockey Alyce Ditch, [place] 06/14/23 [phone]

## 2023-06-29 ENCOUNTER — Emergency Department (HOSPITAL_COMMUNITY): Payer: No Typology Code available for payment source

## 2023-06-29 ENCOUNTER — Emergency Department (HOSPITAL_COMMUNITY)
Admission: EM | Admit: 2023-06-29 | Discharge: 2023-06-29 | Disposition: A | Discharge status: Home / Self Care | Payer: No Typology Code available for payment source | Attending: Emergency Medicine | Admitting: Emergency Medicine

## 2023-06-29 ENCOUNTER — Encounter (HOSPITAL_COMMUNITY): Payer: Self-pay | Admitting: Pharmacy Technician

## 2023-06-29 ENCOUNTER — Other Ambulatory Visit: Payer: Self-pay

## 2023-06-29 DIAGNOSIS — D72829 Elevated white blood cell count, unspecified: Secondary | ICD-10-CM | POA: Diagnosis not present

## 2023-06-29 DIAGNOSIS — S0101XA Laceration without foreign body of scalp, initial encounter: Secondary | ICD-10-CM | POA: Insufficient documentation

## 2023-06-29 DIAGNOSIS — Y92410 Unspecified street and highway as the place of occurrence of the external cause: Secondary | ICD-10-CM | POA: Diagnosis not present

## 2023-06-29 LAB — URINALYSIS, ROUTINE W REFLEX MICROSCOPIC
Bilirubin Urine: NEGATIVE
Glucose, UA: NEGATIVE mg/dL
Hgb urine dipstick: NEGATIVE
Ketones, ur: NEGATIVE mg/dL
Leukocytes,Ua: NEGATIVE
Nitrite: NEGATIVE
Protein, ur: 30 mg/dL — AB
Specific Gravity, Urine: 1.016 (ref 1.005–1.030)
pH: 8 (ref 5.0–8.0)

## 2023-06-29 LAB — I-STAT CHEM 8, ED
BUN: 16 mg/dL (ref 6–20)
Calcium, Ion: 1.08 mmol/L — ABNORMAL LOW (ref 1.15–1.40)
Chloride: 104 mmol/L (ref 98–111)
Creatinine, Ser: 0.8 mg/dL (ref 0.44–1.00)
Glucose, Bld: 116 mg/dL — ABNORMAL HIGH (ref 70–99)
HCT: 43 % (ref 36.0–46.0)
Hemoglobin: 14.6 g/dL (ref 12.0–15.0)
Potassium: 3.7 mmol/L (ref 3.5–5.1)
Sodium: 139 mmol/L (ref 135–145)
TCO2: 22 mmol/L (ref 22–32)

## 2023-06-29 LAB — CBC
HCT: 44.5 % (ref 36.0–46.0)
Hemoglobin: 15.3 g/dL — ABNORMAL HIGH (ref 12.0–15.0)
MCH: 30.6 pg (ref 26.0–34.0)
MCHC: 34.4 g/dL (ref 30.0–36.0)
MCV: 89 fL (ref 80.0–100.0)
Platelets: 304 10*3/uL (ref 150–400)
RBC: 5 MIL/uL (ref 3.87–5.11)
RDW: 12 % (ref 11.5–15.5)
WBC: 13.7 10*3/uL — ABNORMAL HIGH (ref 4.0–10.5)
nRBC: 0 % (ref 0.0–0.2)

## 2023-06-29 LAB — COMPREHENSIVE METABOLIC PANEL
ALT: 20 U/L (ref 0–44)
AST: 32 U/L (ref 15–41)
Albumin: 4.1 g/dL (ref 3.5–5.0)
Alkaline Phosphatase: 64 U/L (ref 38–126)
Anion gap: 12 (ref 5–15)
BUN: 14 mg/dL (ref 6–20)
CO2: 22 mmol/L (ref 22–32)
Calcium: 9.1 mg/dL (ref 8.9–10.3)
Chloride: 103 mmol/L (ref 98–111)
Creatinine, Ser: 0.85 mg/dL (ref 0.44–1.00)
GFR, Estimated: >60 mL/min (ref >60)
Glucose, Bld: 118 mg/dL — ABNORMAL HIGH (ref 70–99)
Potassium: 3.8 mmol/L (ref 3.5–5.1)
Sodium: 137 mmol/L (ref 135–145)
Total Bilirubin: 0.6 mg/dL (ref 0.0–1.2)
Total Protein: 7.2 g/dL (ref 6.5–8.1)

## 2023-06-29 LAB — PROTIME-INR
INR: 1 (ref 0.8–1.2)
Prothrombin Time: 13.4 s (ref 11.4–15.2)

## 2023-06-29 LAB — SAMPLE TO BLOOD BANK

## 2023-06-29 LAB — ETHANOL: Alcohol, Ethyl (B): <10 mg/dL (ref <10)

## 2023-06-29 LAB — HCG, SERUM, QUALITATIVE: Preg, Serum: NEGATIVE

## 2023-06-29 MED ORDER — TETANUS-DIPHTH-ACELL PERTUSSIS 5-2.5-18.5 LF-MCG/0.5 IM SUSY
0.5000 mL | PREFILLED_SYRINGE | Freq: Once | INTRAMUSCULAR | Status: DC
  Filled 2023-06-29: qty 0.5

## 2023-06-29 MED ORDER — LIDOCAINE-EPINEPHRINE 1 %-1:100000 IJ SOLN
20.0000 mL | Freq: Once | INTRAMUSCULAR | Status: AC
Start: 2023-06-29 — End: 2023-06-29
  Administered 2023-06-29: 20 mL via INTRADERMAL
  Filled 2023-06-29: qty 1

## 2023-06-29 MED ORDER — METHOCARBAMOL 500 MG PO TABS
500.0000 mg | ORAL_TABLET | Freq: Once | ORAL | Status: AC
Start: 2023-06-29 — End: 2023-06-29
  Administered 2023-06-29: 500 mg via ORAL
  Filled 2023-06-29: qty 1

## 2023-06-29 MED ORDER — METHOCARBAMOL 500 MG PO TABS: 500.0000 mg | ORAL_TABLET | Freq: Two times a day (BID) | ORAL | 0 refills | Status: DC | PRN
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
## 2023-11-27 ENCOUNTER — Telehealth: Payer: Self-pay | Admitting: Neurology

## 2023-11-27 NOTE — Telephone Encounter (Signed)
 LVM and sent mychart msg informing pt of need to reschedule 01/17/24 appt - MD out

## 2023-12-27 ENCOUNTER — Encounter: Payer: Self-pay | Admitting: *Deleted

## 2023-12-27 ENCOUNTER — Telehealth: Payer: Self-pay | Admitting: Neurology

## 2023-12-27 NOTE — Telephone Encounter (Signed)
 LVM and sent mychart msg informing pt of need to reschedule 01/09/24 appt - MD out

## 2023-12-28 ENCOUNTER — Ambulatory Visit (INDEPENDENT_AMBULATORY_CARE_PROVIDER_SITE_OTHER): Admitting: Neurology

## 2023-12-28 ENCOUNTER — Encounter: Payer: Self-pay | Admitting: Neurology

## 2023-12-28 VITALS — BP 140/89 | HR 71 | Ht 59.0 in | Wt 205.0 lb

## 2023-12-28 DIAGNOSIS — Z79899 Other long term (current) drug therapy: Secondary | ICD-10-CM

## 2023-12-28 DIAGNOSIS — G43909 Migraine, unspecified, not intractable, without status migrainosus: Secondary | ICD-10-CM

## 2023-12-28 DIAGNOSIS — R0681 Apnea, not elsewhere classified: Secondary | ICD-10-CM

## 2023-12-28 DIAGNOSIS — Z9189 Other specified personal risk factors, not elsewhere classified: Secondary | ICD-10-CM | POA: Diagnosis not present

## 2023-12-28 DIAGNOSIS — R0683 Snoring: Secondary | ICD-10-CM | POA: Diagnosis not present

## 2023-12-28 DIAGNOSIS — Z820 Family history of epilepsy and other diseases of the nervous system: Secondary | ICD-10-CM

## 2023-12-28 DIAGNOSIS — G4489 Other headache syndrome: Secondary | ICD-10-CM

## 2023-12-28 DIAGNOSIS — R519 Headache, unspecified: Secondary | ICD-10-CM

## 2023-12-28 MED ORDER — ZOLMITRIPTAN 2.5 MG PO TABS: 2.5000 mg | ORAL_TABLET | ORAL | 0 refills | Status: AC | PRN
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
## 2023-12-28 NOTE — Patient Instructions (Addendum)
 It was nice to meet you today.   As discussed, your headaches are likely due to a combination of factors.   Here is what we discussed today and my recommendations for you:   Please remember, common headache triggers are: sleep deprivation, dehydration, overheating, stress, hypoglycemia or skipping meals and blood sugar fluctuations, excessive pain medications or excessive alcohol use or caffeine  withdrawal. Some people have food triggers such as aged cheese, orange juice or chocolate, especially dark chocolate, or MSG (monosodium glutamate). Try to avoid these headache triggers as much possible. It may be helpful to keep a headache diary to figure out what makes your headaches worse or brings them on and what alleviates them. Some people report headache onset after exercise but studies have shown that regular exercise may actually prevent headaches from coming. If you have exercise-induced headaches, please make sure that you drink plenty of fluid before and after exercising and that you do not over do it and do not overheat. Please limit caffeine  intake and increase water intake to about 8 cups of water per day.   Please see any eye doctor such as optometrist or eye physician, ophthalmologist, of your choosing for a full, dilated eye examination.  Strain on the eyes can perpetuate headaches.   We will do a brain scan, called MRI and call you with the test results. We will have to schedule you for this on a separate date. This test requires authorization from your insurance, and we will take care of the insurance process. I will order a sleep study to look for signs of obstructive sleep apnea (aka OSA). As explained, the long-term risks and ramifications of untreated moderate to severe obstructive sleep apnea may include (but are not limited to): increased risk for cardiovascular disease, including congestive heart failure, stroke, difficult to control hypertension, treatment resistant obesity,  arrhythmias, especially irregular heartbeat commonly known as A. Fib. (atrial fibrillation); even type 2 diabetes has been linked to untreated OSA.  For headache acute management, we can try you on Zomig 2.5 mg strength: take 1 pill early on when you suspect a migraine attack come on. You may take another pill after 2 hours, no more than 2 pills in 24 hours. Most people who take triptans do not have any serious side-effects. However, they can cause drowsiness (remember to not drive or use heavy machinery when drowsy), nausea, dizziness, dry mouth. Less common side effects include strange sensations, such as tightness in your chest or throat, tingling, flushing, and feelings of heaviness or pressure in areas such as the face, limbs, and chest. These in the chest can mimic heart related pain (angina) and may cause alarm, but usually these sensations are not harmful or a sign of a heart attack. However, if you develop intense chest pain or sensations of discomfort, you should stop taking your medication and consult with me or your PCP or go to the nearest urgent care facility or ER or call 911.  Please call us  back or email us  through MyChart/send a message listing medications you may have tried before for migraine headaches, you recall trying Imitrex  and Maxalt before.  You also reported trying Topamax before.  You can call your pharmacy for additional information on previous prescriptions and you can also call your previous primary care office, Dr. Audria office for prior records.   Please keep in mind that Adderall and Vyvanse can also exacerbate headaches, please talk to your prescriber about reducing your stimulant medication. We will plan a  follow up in about 3 months.  We will keep you posted as to your brain MRI results and sleep study results in the interim by phone call and/or MyChart message.

## 2024-01-02 ENCOUNTER — Telehealth: Payer: Self-pay | Admitting: Neurology

## 2024-01-02 NOTE — Telephone Encounter (Signed)
 [employer] [place]: [id number] exp. 01/02/24-03/01/24 sent to GI [phone]

## 2024-01-04 ENCOUNTER — Telehealth: Payer: Self-pay | Admitting: Neurology

## 2024-01-04 NOTE — Telephone Encounter (Signed)
 NPSg MCD Healthy [place] pending

## 2024-01-09 ENCOUNTER — Ambulatory Visit: Admitting: Neurology

## 2024-01-09 NOTE — Telephone Encounter (Signed)
Checked status it is still pending.  

## 2024-01-10 ENCOUNTER — Other Ambulatory Visit (HOSPITAL_COMMUNITY): Payer: Self-pay

## 2024-01-10 ENCOUNTER — Telehealth: Payer: Self-pay

## 2024-01-10 NOTE — Telephone Encounter (Signed)
\  NPSG MCD Healthy blue shara: [id number] (exp. 01/04/24 to 03/03/24)   Patient is scheduled at [facility] for 02/04/2024 at 9 pm.  Sent mychart

## 2024-01-10 NOTE — Telephone Encounter (Signed)
 Pharmacy Patient Advocate Encounter   Received notification from CoverMyMeds that prior authorization for ZOLMitriptan  2.5MG  tablets is required/requested.   Insurance verification completed.   The patient is insured through [facility] .   Per test claim: PA required; PA submitted to above mentioned insurance via CoverMyMeds Key/confirmation #/[id number] Status is pending

## 2024-01-10 NOTE — Telephone Encounter (Signed)
 NPSG MCD healthy blue shara: [id number] (exp. 01/04/24 to 03/03/24)

## 2024-01-10 NOTE — Telephone Encounter (Signed)
 Pharmacy Patient Advocate Encounter  Received notification from [facility] that Prior Authorization for ZOLMitriptan  2.5MG  tablets has been APPROVED from 01/10/2024 to 01/09/2025   PA #/Case ID/Reference #: PA Case ID #: [id number]

## 2024-01-17 ENCOUNTER — Ambulatory Visit: Admitting: Neurology

## 2024-02-04 ENCOUNTER — Encounter

## 2024-02-08 ENCOUNTER — Inpatient Hospital Stay: Admission: RE | Admit: 2024-02-08 | Source: Ambulatory Visit

## 2024-04-01 ENCOUNTER — Telehealth: Payer: Self-pay | Admitting: Neurology

## 2024-04-01 ENCOUNTER — Ambulatory Visit: Admitting: Neurology

## 2024-04-01 NOTE — Telephone Encounter (Signed)
 Provider out

## 2024-04-16 ENCOUNTER — Encounter: Payer: Self-pay | Admitting: Neurology
